# Patient Record
Sex: Male | Born: 1950 | Race: Black or African American | Hispanic: No | Marital: Single | Smoking: Never smoker
Health system: Southern US, Community
[De-identification: ages and names within clinical notes are randomized; demographics above are authoritative.]

## PROBLEM LIST (undated history)

## (undated) DIAGNOSIS — E119 Type 2 diabetes mellitus without complications: Secondary | ICD-10-CM

## (undated) DIAGNOSIS — I10 Essential (primary) hypertension: Secondary | ICD-10-CM

## (undated) DIAGNOSIS — I639 Cerebral infarction, unspecified: Secondary | ICD-10-CM

## (undated) DIAGNOSIS — M25512 Pain in left shoulder: Secondary | ICD-10-CM

## (undated) DIAGNOSIS — I63511 Cerebral infarction due to unspecified occlusion or stenosis of right middle cerebral artery: Secondary | ICD-10-CM

## (undated) DIAGNOSIS — J4520 Mild intermittent asthma, uncomplicated: Secondary | ICD-10-CM

## (undated) DIAGNOSIS — G8929 Other chronic pain: Secondary | ICD-10-CM

## (undated) DIAGNOSIS — K219 Gastro-esophageal reflux disease without esophagitis: Secondary | ICD-10-CM

## (undated) HISTORY — DX: Essential (primary) hypertension: I10

## (undated) HISTORY — DX: Gastro-esophageal reflux disease without esophagitis: K21.9

## (undated) HISTORY — DX: Cerebral infarction due to unspecified occlusion or stenosis of right middle cerebral artery: I63.511

## (undated) HISTORY — DX: Mild intermittent asthma, uncomplicated: J45.20

## (undated) HISTORY — DX: Pain in left shoulder: M25.512

## (undated) HISTORY — DX: Other chronic pain: G89.29

## (undated) HISTORY — PX: APPENDECTOMY: SHX54

---

## 2014-06-18 ENCOUNTER — Encounter (HOSPITAL_COMMUNITY): Payer: Self-pay | Admitting: Emergency Medicine

## 2014-06-18 ENCOUNTER — Emergency Department (HOSPITAL_COMMUNITY)

## 2014-06-18 ENCOUNTER — Inpatient Hospital Stay (HOSPITAL_COMMUNITY)
Admission: EM | Admit: 2014-06-18 | Discharge: 2014-06-22 | DRG: 041 | Disposition: A | Discharge status: Home / Self Care | Attending: Internal Medicine | Admitting: Internal Medicine

## 2014-06-18 DIAGNOSIS — R9431 Abnormal electrocardiogram [ECG] [EKG]: Secondary | ICD-10-CM | POA: Insufficient documentation

## 2014-06-18 DIAGNOSIS — G8190 Hemiplegia, unspecified affecting unspecified side: Secondary | ICD-10-CM | POA: Diagnosis present

## 2014-06-18 DIAGNOSIS — R4789 Other speech disturbances: Secondary | ICD-10-CM | POA: Diagnosis present

## 2014-06-18 DIAGNOSIS — R209 Unspecified disturbances of skin sensation: Secondary | ICD-10-CM | POA: Diagnosis present

## 2014-06-18 DIAGNOSIS — I6350 Cerebral infarction due to unspecified occlusion or stenosis of unspecified cerebral artery: Principal | ICD-10-CM | POA: Diagnosis present

## 2014-06-18 DIAGNOSIS — Z7982 Long term (current) use of aspirin: Secondary | ICD-10-CM

## 2014-06-18 DIAGNOSIS — E785 Hyperlipidemia, unspecified: Secondary | ICD-10-CM | POA: Diagnosis present

## 2014-06-18 DIAGNOSIS — R29898 Other symptoms and signs involving the musculoskeletal system: Secondary | ICD-10-CM | POA: Diagnosis present

## 2014-06-18 DIAGNOSIS — M25512 Pain in left shoulder: Secondary | ICD-10-CM

## 2014-06-18 DIAGNOSIS — E119 Type 2 diabetes mellitus without complications: Secondary | ICD-10-CM | POA: Diagnosis present

## 2014-06-18 DIAGNOSIS — J45909 Unspecified asthma, uncomplicated: Secondary | ICD-10-CM | POA: Diagnosis present

## 2014-06-18 DIAGNOSIS — J4520 Mild intermittent asthma, uncomplicated: Secondary | ICD-10-CM | POA: Insufficient documentation

## 2014-06-18 DIAGNOSIS — I10 Essential (primary) hypertension: Secondary | ICD-10-CM | POA: Diagnosis present

## 2014-06-18 DIAGNOSIS — I63511 Cerebral infarction due to unspecified occlusion or stenosis of right middle cerebral artery: Secondary | ICD-10-CM | POA: Diagnosis present

## 2014-06-18 DIAGNOSIS — K219 Gastro-esophageal reflux disease without esophagitis: Secondary | ICD-10-CM | POA: Diagnosis present

## 2014-06-18 DIAGNOSIS — Z79899 Other long term (current) drug therapy: Secondary | ICD-10-CM

## 2014-06-18 DIAGNOSIS — G8929 Other chronic pain: Secondary | ICD-10-CM | POA: Insufficient documentation

## 2014-06-18 DIAGNOSIS — I639 Cerebral infarction, unspecified: Secondary | ICD-10-CM | POA: Diagnosis present

## 2014-06-18 DIAGNOSIS — Z8249 Family history of ischemic heart disease and other diseases of the circulatory system: Secondary | ICD-10-CM

## 2014-06-18 DIAGNOSIS — G459 Transient cerebral ischemic attack, unspecified: Secondary | ICD-10-CM

## 2014-06-18 HISTORY — DX: Type 2 diabetes mellitus without complications: E11.9

## 2014-06-18 HISTORY — DX: Cerebral infarction, unspecified: I63.9

## 2014-06-18 HISTORY — DX: Essential (primary) hypertension: I10

## 2014-06-18 LAB — COMPREHENSIVE METABOLIC PANEL
ALBUMIN: 4.3 g/dL (ref 3.5–5.2)
ALT: 21 U/L (ref 0–53)
AST: 26 U/L (ref 0–37)
Alkaline Phosphatase: 109 U/L (ref 39–117)
Anion gap: 13 (ref 5–15)
BILIRUBIN TOTAL: 0.4 mg/dL (ref 0.3–1.2)
BUN: 17 mg/dL (ref 6–23)
CALCIUM: 9.2 mg/dL (ref 8.4–10.5)
CHLORIDE: 106 meq/L (ref 96–112)
CO2: 25 mEq/L (ref 19–32)
Creatinine, Ser: 1.15 mg/dL (ref 0.50–1.35)
GFR calc Af Amer: 77 mL/min — ABNORMAL LOW (ref >90)
GFR calc non Af Amer: 66 mL/min — ABNORMAL LOW (ref >90)
Glucose, Bld: 96 mg/dL (ref 70–99)
Potassium: 3.9 mEq/L (ref 3.7–5.3)
SODIUM: 144 meq/L (ref 137–147)
Total Protein: 7.9 g/dL (ref 6.0–8.3)

## 2014-06-18 LAB — GLUCOSE, CAPILLARY
GLUCOSE-CAPILLARY: 148 mg/dL — AB (ref 70–99)
Glucose-Capillary: 76 mg/dL (ref 70–99)

## 2014-06-18 LAB — CBC
HCT: 43.5 % (ref 39.0–52.0)
Hemoglobin: 14.5 g/dL (ref 13.0–17.0)
MCH: 28.4 pg (ref 26.0–34.0)
MCHC: 33.3 g/dL (ref 30.0–36.0)
MCV: 85.1 fL (ref 78.0–100.0)
PLATELETS: 239 10*3/uL (ref 150–400)
RBC: 5.11 MIL/uL (ref 4.22–5.81)
RDW: 13.9 % (ref 11.5–15.5)
WBC: 9.8 10*3/uL (ref 4.0–10.5)

## 2014-06-18 LAB — LIPID PANEL
Cholesterol: 239 mg/dL — ABNORMAL HIGH (ref 0–200)
HDL: 36 mg/dL — AB (ref >39)
LDL Cholesterol: 178 mg/dL — ABNORMAL HIGH (ref 0–99)
TRIGLYCERIDES: 123 mg/dL (ref <150)
Total CHOL/HDL Ratio: 6.6 RATIO
VLDL: 25 mg/dL (ref 0–40)

## 2014-06-18 LAB — RAPID URINE DRUG SCREEN, HOSP PERFORMED
AMPHETAMINES: NOT DETECTED
BARBITURATES: NOT DETECTED
BENZODIAZEPINES: NOT DETECTED
Cocaine: NOT DETECTED
Opiates: NOT DETECTED
Tetrahydrocannabinol: NOT DETECTED

## 2014-06-18 LAB — DIFFERENTIAL
BASOS ABS: 0 10*3/uL (ref 0.0–0.1)
BASOS PCT: 0 % (ref 0–1)
Eosinophils Absolute: 0.2 10*3/uL (ref 0.0–0.7)
Eosinophils Relative: 2 % (ref 0–5)
Lymphocytes Relative: 34 % (ref 12–46)
Lymphs Abs: 3.4 10*3/uL (ref 0.7–4.0)
Monocytes Absolute: 0.5 10*3/uL (ref 0.1–1.0)
Monocytes Relative: 5 % (ref 3–12)
NEUTROS ABS: 5.7 10*3/uL (ref 1.7–7.7)
Neutrophils Relative %: 59 % (ref 43–77)

## 2014-06-18 LAB — HEMOGLOBIN A1C
Hgb A1c MFr Bld: 6.7 % — ABNORMAL HIGH (ref <5.7)
Mean Plasma Glucose: 146 mg/dL — ABNORMAL HIGH (ref <117)

## 2014-06-18 LAB — APTT: APTT: 33 s (ref 24–37)

## 2014-06-18 LAB — I-STAT TROPONIN, ED: TROPONIN I, POC: 0 ng/mL (ref 0.00–0.08)

## 2014-06-18 LAB — TROPONIN I: Troponin I: <0.3 ng/mL (ref <0.30)

## 2014-06-18 LAB — PROTIME-INR
INR: 1.02 (ref 0.00–1.49)
PROTHROMBIN TIME: 13.4 s (ref 11.6–15.2)

## 2014-06-18 MED ORDER — ATORVASTATIN CALCIUM 40 MG PO TABS
40.0000 mg | ORAL_TABLET | Freq: Every day | ORAL | Status: DC
  Administered 2014-06-18 – 2014-06-22 (×5): 40 mg via ORAL
  Filled 2014-06-18 (×5): qty 1

## 2014-06-18 MED ORDER — TRAMADOL HCL 50 MG PO TABS: 50.0000 mg | ORAL_TABLET | Freq: Four times a day (QID) | ORAL | Status: DC | PRN

## 2014-06-18 MED ORDER — ASPIRIN EC 81 MG PO TBEC: 81.0000 mg | DELAYED_RELEASE_TABLET | Freq: Every day | ORAL | Status: DC

## 2014-06-18 MED ORDER — LOSARTAN POTASSIUM 50 MG PO TABS: 25.0000 mg | ORAL_TABLET | Freq: Every day | ORAL | Status: DC

## 2014-06-18 MED ORDER — STROKE: EARLY STAGES OF RECOVERY BOOK
Freq: Once | Status: AC
  Administered 2014-06-18: 18:00:00
  Filled 2014-06-18: qty 1

## 2014-06-18 MED ORDER — STUDY - INVESTIGATIONAL DRUG SIMPLE RECORD
90.0000 mg | Freq: Two times a day (BID) | Status: DC
  Filled 2014-06-18: qty 90

## 2014-06-18 MED ORDER — STUDY - INVESTIGATIONAL DRUG SIMPLE RECORD
300.0000 mg | Freq: Once | Status: AC
  Administered 2014-06-18: 300 mg via ORAL
  Filled 2014-06-18: qty 300

## 2014-06-18 MED ORDER — STUDY - INVESTIGATIONAL DRUG SIMPLE RECORD
90.0000 mg | Freq: Two times a day (BID) | Status: DC
  Administered 2014-06-19 – 2014-06-22 (×7): 90 mg via ORAL
  Filled 2014-06-18 (×4): qty 90

## 2014-06-18 MED ORDER — ASPIRIN 325 MG PO TABS: 325.0000 mg | ORAL_TABLET | Freq: Every day | ORAL | Status: DC

## 2014-06-18 MED ORDER — PANTOPRAZOLE SODIUM 40 MG PO TBEC
40.0000 mg | DELAYED_RELEASE_TABLET | Freq: Every day | ORAL | Status: DC
Start: 2014-06-19 — End: 2014-06-22
  Administered 2014-06-19 – 2014-06-22 (×4): 40 mg via ORAL
  Filled 2014-06-18 (×4): qty 1

## 2014-06-18 MED ORDER — ASPIRIN 325 MG PO TABS
325.0000 mg | ORAL_TABLET | Freq: Every day | ORAL | Status: DC
Start: 2014-06-18 — End: 2014-06-18

## 2014-06-18 MED ORDER — INSULIN ASPART 100 UNIT/ML ~~LOC~~ SOLN
0.0000 [IU] | Freq: Three times a day (TID) | SUBCUTANEOUS | Status: DC
  Administered 2014-06-19 – 2014-06-20 (×3): 1 [IU] via SUBCUTANEOUS
  Administered 2014-06-22: 2 [IU] via SUBCUTANEOUS

## 2014-06-18 MED ORDER — STUDY - INVESTIGATIONAL DRUG SIMPLE RECORD
100.0000 mg | Freq: Every day | Status: DC
  Administered 2014-06-19 – 2014-06-22 (×4): 100 mg via ORAL
  Filled 2014-06-18 (×3): qty 100

## 2014-06-18 MED ORDER — INSULIN GLARGINE 100 UNIT/ML ~~LOC~~ SOLN
10.0000 [IU] | Freq: Every day | SUBCUTANEOUS | Status: DC
  Administered 2014-06-18 – 2014-06-21 (×4): 10 [IU] via SUBCUTANEOUS
  Filled 2014-06-18 (×5): qty 0.1

## 2014-06-18 MED ORDER — STUDY - INVESTIGATIONAL DRUG SIMPLE RECORD
180.0000 mg | Freq: Once | Status: AC
  Administered 2014-06-18: 180 mg via ORAL
  Filled 2014-06-18: qty 180

## 2014-06-18 NOTE — Progress Notes (Signed)
Attempted to get report from ED @1459 , RN is busy and unable to give report at this time. Left number for nurse to call 4N back when gets a chance Leevon Upperman, [place] Marie, RN 2:49 PM   .

## 2014-06-18 NOTE — ED Notes (Signed)
Per pt today he had a 10 min episode of left arm numbness and weakness today while driving. sts he lost control of arm. sts also left leg was affected. sts all symptoms have resolved and he feels normal now. sts he has had a few episodes like this over the past few weeks but not like today.

## 2014-06-18 NOTE — Progress Notes (Signed)
PT Cancellation Note  Patient Details Name: Stephen Mccullough MRN: [id number] DOB: 03-25-1951   Cancelled Treatment:    Reason Eval/Treat Not Completed: Other (comment) (pt on bedrest).  PT paged resident.  Will check back later today or tomorrow as time allows.   Thanks,    Rollene Rotundaebecca B. Bradely Rudin, PT, DPT [phone]   06/18/2014, 3:46 PM

## 2014-06-18 NOTE — ED Notes (Signed)
Patient states that he was driving his truck and his left side went numb and he could not move. Patient states that he was stopped at a stop sign at the time and he was unable to push the clutch pedal in. States that he called his brother to help him.  Onset was 11 AM. Duration was about 15 minutes.  Patient is asymptomatic at this time.

## 2014-06-18 NOTE — Consult Note (Signed)
Referring Physician: Joines    Chief Complaint: TIA  HPI:                                                                                                                                         Stephen Mccullough is an 63 y.o. male who has noted over the last few days he has had intermittent numbness and tingling in his left forearm. HE states he has two episodes a day on average.  Today he had just finished mowing a lawn when he stopped at his house.  He went to push the clutch with his left foot and noted he had no strength and could not feel the pedal. At the same time he noted his left hand was week and had no strength.  He called his brother who came over and helped him out of the car.  Within 15 minutes the symptoms resolved.   He takes ASA daily  Date last known well: Date: 06/18/2014 Time last known well: Time: 11:00 tPA Given: No: symptoms fully resolved.   Past Medical History  Diagnosis Date  . Hypertension   . Diabetes mellitus without complication     Past Surgical History  Procedure Laterality Date  . Appendectomy      Family History  Problem Relation Age of Onset  . Hypertension Mother   . Hypertension Father    Social History:  reports that he has never smoked. He does not have any smokeless tobacco history on file. He reports that he does not drink alcohol. His drug history is not on file.  Allergies: No Known Allergies  Medications:                                                                                                                           ASA  Insulin and lantus   ROS:  History obtained from the patient  General ROS: negative for - chills, fatigue, fever, night sweats, weight gain or weight loss Psychological ROS: negative for - behavioral disorder, hallucinations, memory difficulties, mood swings or suicidal  ideation Ophthalmic ROS: negative for - blurry vision, double vision, eye pain or loss of vision ENT ROS: negative for - epistaxis, nasal discharge, oral lesions, sore throat, tinnitus or vertigo Allergy and Immunology ROS: negative for - hives or itchy/watery eyes Hematological and Lymphatic ROS: negative for - bleeding problems, bruising or swollen lymph nodes Endocrine ROS: negative for - galactorrhea, hair pattern changes, polydipsia/polyuria or temperature intolerance Respiratory ROS: negative for - cough, hemoptysis, shortness of breath or wheezing Cardiovascular ROS: negative for - chest pain, dyspnea on exertion, edema or irregular heartbeat Gastrointestinal ROS: negative for - abdominal pain, diarrhea, hematemesis, nausea/vomiting or stool incontinence Genito-Urinary ROS: negative for - dysuria, hematuria, incontinence or urinary frequency/urgency Musculoskeletal ROS: negative for - joint swelling or muscular weakness Neurological ROS: as noted in HPI Dermatological ROS: negative for rash and skin lesion changes  Neurologic Examination:                                                                                                      Blood pressure 143/73, pulse 76, temperature 98.1 F (36.7 C), temperature source Oral, resp. rate 18, weight 83.915 kg (185 lb), SpO2 97.00%.   General: NAD Mental Status: Alert, oriented, thought content appropriate.  Speech fluent without evidence of aphasia.  Able to follow 3 step commands without difficulty. Cranial Nerves: II: Discs flat bilaterally; Visual fields grossly normal, pupils equal, round, reactive to light and accommodation III,IV, VI: ptosis not present, extra-ocular motions intact bilaterally V,VII: smile symmetric, facial light touch sensation normal bilaterally VIII: hearing normal bilaterally IX,X: gag reflex present XI: bilateral shoulder shrug XII: midline tongue extension without atrophy or fasciculations  Motor: Right  : Upper extremity   5/5    Left:     Upper extremity   5/5  Lower extremity   5/5     Lower extremity   5/5 Tone and bulk:normal tone throughout; no atrophy noted Sensory: Pinprick and light touch intact throughout, bilaterally --positive Tinnels on left arm --decreased temperature in stocking distribution Deep Tendon Reflexes:  Right: Upper Extremity   Left: Upper extremity   biceps (C-5 to C-6) 2/4   biceps (C-5 to C-6) 2/4 tricep (C7) 2/4    triceps (C7) 2/4 Brachioradialis (C6) 2/4  Brachioradialis (C6) 2/4  Lower Extremity Lower Extremity  quadriceps (L-2 to L-4) 1/4   quadriceps (L-2 to L-4) 1/4 Achilles (S1) 0/4   Achilles (S1) 0/4  Plantars: Right: downgoing   Left: downgoing Cerebellar: normal finger-to-nose,  normal heel-to-shin test Gait: not tested due to multiple leads.  CV: pulses palpable throughout    Lab Results: Basic Metabolic Panel:  Recent Labs Lab 06/18/14 1226  NA 144  K 3.9  CL 106  CO2 25  GLUCOSE 96  BUN 17  CREATININE 1.15  CALCIUM 9.2    Liver Function Tests:  Recent Labs Lab 06/18/14 1226  AST 26  ALT 21  ALKPHOS 109  BILITOT 0.4  PROT 7.9  ALBUMIN 4.3   No results found for this basename: LIPASE, AMYLASE,  in the last 168 hours No results found for this basename: AMMONIA,  in the last 168 hours  CBC:  Recent Labs Lab 06/18/14 1226  WBC 9.8  NEUTROABS 5.7  HGB 14.5  HCT 43.5  MCV 85.1  PLT 239    Cardiac Enzymes: No results found for this basename: CKTOTAL, CKMB, CKMBINDEX, TROPONINI,  in the last 168 hours  Lipid Panel: No results found for this basename: CHOL, TRIG, HDL, CHOLHDL, VLDL, LDLCALC,  in the last 168 hours  CBG: No results found for this basename: GLUCAP,  in the last 168 hours  Microbiology: No results found for this or any previous visit.  Coagulation Studies:  Recent Labs  06/18/14 1226  LABPROT 13.4  INR 1.02    Imaging: Ct Head (brain) Wo Contrast  06/18/2014   CLINICAL DATA:   Left arm and leg weakness.  EXAM: CT HEAD WITHOUT CONTRAST  TECHNIQUE: Contiguous axial images were obtained from the base of the skull through the vertex without intravenous contrast.  COMPARISON:  None.  FINDINGS: Chronic microvascular changes throughout the deep white matter. No acute intracranial abnormality. Specifically, no hemorrhage, hydrocephalus, mass lesion, acute infarction, or significant intracranial injury. No acute calvarial abnormality. Visualized paranasal sinuses and mastoids clear. Orbital soft tissues unremarkable.  IMPRESSION: Chronic small vessel disease throughout the deep white matter. No acute intracranial abnormality.   Electronically Signed   By: Charlett Nose M.D.   On: 06/18/2014 12:52       Assessment and plan discussed with with attending physician and they are in agreement.    Felicie Morn PA-C [facility] [phone]  06/18/2014, 2:02 PM   Assessment: 63 y.o. male with transient left arm and leg paresthesia and weakness. At time of consultation symptoms had fully resolved.  With stroke risk factors and symptoms likely TIA. Patient would benefit from TIA work up.   Stroke Risk Factors - diabetes mellitus and hypertension  1. HgbA1c, fasting lipid panel 2. MRI, MRA  of the brain without contrast 3. PT consult, OT consult, Speech consult 4. Echocardiogram 5. Carotid dopplers 6. Prophylactic therapy-Antiplatelet med: Aspirin - dose 325 daily 7. Risk factor modification 8. Telemetry monitoring 9. Frequent neuro checks  Ritta Slot, MD [facility] [phone]  If 7pm- 7am, please page neurology on call as listed in [facility]ION.

## 2014-06-18 NOTE — ED Provider Notes (Signed)
CSN: [id number]     Arrival date & time 06/18/14  1205 History   First MD Initiated Contact with Patient 06/18/14 1227     Chief Complaint  Patient presents with  . Numbness  . Weakness     (Consider location/radiation/quality/duration/timing/severity/associated sxs/prior Treatment) HPI Comments: The patient is a 63 year old male past medical history of hypertension and diabetes presents emergency room chief complaint of left-sided weakness at approximately 11:00. The patient reports he was unable to move his left arm and left lower leg while driving. He reports he had to be helped out of the car by friends. He reports symptoms of resolved, after approximately 15 minutes. The patient reports left arm paresthesias last night. He denies history of CVA TIA denies history of anticoagulation. Reports ASA 81 mg daily. Denies EtOH or drug use. PCP [facility]  Patient is a 63 y.o. male presenting with weakness. The history is provided by the patient. No language interpreter was used.  Weakness Associated symptoms include numbness and weakness. Pertinent negatives include no abdominal pain or headaches.    Past Medical History  Diagnosis Date  . Hypertension   . Diabetes mellitus without complication    Past Surgical History  Procedure Laterality Date  . Appendectomy     History reviewed. No pertinent family history. History  Substance Use Topics  . Smoking status: Never Smoker   . Smokeless tobacco: Not on file  . Alcohol Use: No    Review of Systems  Eyes: Negative for visual disturbance.  Gastrointestinal: Negative for abdominal pain.  Neurological: Positive for weakness and numbness. Negative for syncope, light-headedness and headaches.      Allergies  Review of patient's allergies indicates no known allergies.  Home Medications   Prior to Admission medications   Not on File   BP 143/73  Pulse 76  Temp(Src) 97.4 F (36.3 C) (Oral)  Resp 18  Wt 185 lb (83.915 kg)   SpO2 97% Physical Exam  Nursing note and vitals reviewed. Constitutional: He appears well-developed and well-nourished. No distress.  HENT:  Head: Normocephalic and atraumatic.  Eyes: EOM are normal. Pupils are equal, round, and reactive to light.  Neck: Normal range of motion. Neck supple.  Cardiovascular: Normal rate.  An irregular rhythm present.  Pulses:      Radial pulses are 2+ on the right side, and 2+ on the left side.  Pulmonary/Chest: Effort normal and breath sounds normal. He has no wheezes.  Abdominal: Soft. There is no tenderness.  Neurological: He is alert.  Speech is clear and goal oriented, follows commands Cranial nerves III - XII grossly intact, no facial droop Normal strength in upper and lower extremities bilaterally, strong and equal grip strength Sensation normal to light touch Moves all 4 extremities without ataxia, coordination intact. Normal heel to shin bilaterally. Normal finger to nose and rapid alternating movements No pronator drift  Skin: Skin is warm and dry. He is not diaphoretic.  Psychiatric: He has a normal mood and affect. His behavior is normal.    ED Course  Procedures (including critical care time) Labs Review Results for orders placed during the hospital encounter of 06/18/14  PROTIME-INR      Result Value Ref Range   Prothrombin Time 13.4  11.6 - 15.2 seconds   INR 1.02  0.00 - 1.49  APTT      Result Value Ref Range   aPTT 33  24 - 37 seconds  CBC      Result Value Ref  Range   WBC 9.8  4.0 - 10.5 K/uL   RBC 5.11  4.22 - 5.81 MIL/uL   Hemoglobin 14.5  13.0 - 17.0 g/dL   HCT 40.9  81.1 - 91.4 %   MCV 85.1  78.0 - 100.0 fL   MCH 28.4  26.0 - 34.0 pg   MCHC 33.3  30.0 - 36.0 g/dL   RDW 78.2  95.6 - 21.3 %   Platelets 239  150 - 400 K/uL  DIFFERENTIAL      Result Value Ref Range   Neutrophils Relative % 59  43 - 77 %   Neutro Abs 5.7  1.7 - 7.7 K/uL   Lymphocytes Relative 34  12 - 46 %   Lymphs Abs 3.4  0.7 - 4.0 K/uL    Monocytes Relative 5  3 - 12 %   Monocytes Absolute 0.5  0.1 - 1.0 K/uL   Eosinophils Relative 2  0 - 5 %   Eosinophils Absolute 0.2  0.0 - 0.7 K/uL   Basophils Relative 0  0 - 1 %   Basophils Absolute 0.0  0.0 - 0.1 K/uL  COMPREHENSIVE METABOLIC PANEL      Result Value Ref Range   Sodium 144  137 - 147 mEq/L   Potassium 3.9  3.7 - 5.3 mEq/L   Chloride 106  96 - 112 mEq/L   CO2 25  19 - 32 mEq/L   Glucose, Bld 96  70 - 99 mg/dL   BUN 17  6 - 23 mg/dL   Creatinine, Ser 0.86  0.50 - 1.35 mg/dL   Calcium 9.2  8.4 - 57.8 mg/dL   Total Protein 7.9  6.0 - 8.3 g/dL   Albumin 4.3  3.5 - 5.2 g/dL   AST 26  0 - 37 U/L   ALT 21  0 - 53 U/L   Alkaline Phosphatase 109  39 - 117 U/L   Total Bilirubin 0.4  0.3 - 1.2 mg/dL   GFR calc non Af Amer 66 (*) >90 mL/min   GFR calc Af Amer 77 (*) >90 mL/min   Anion gap 13  5 - 15  I-STAT TROPOININ, ED      Result Value Ref Range   Troponin i, poc 0.00  0.00 - 0.08 ng/mL   Comment 3            Ct Head (brain) Wo Contrast  06/18/2014   CLINICAL DATA:  Left arm and leg weakness.  EXAM: CT HEAD WITHOUT CONTRAST  TECHNIQUE: Contiguous axial images were obtained from the base of the skull through the vertex without intravenous contrast.  COMPARISON:  None.  FINDINGS: Chronic microvascular changes throughout the deep white matter. No acute intracranial abnormality. Specifically, no hemorrhage, hydrocephalus, mass lesion, acute infarction, or significant intracranial injury. No acute calvarial abnormality. Visualized paranasal sinuses and mastoids clear. Orbital soft tissues unremarkable.  IMPRESSION: Chronic small vessel disease throughout the deep white matter. No acute intracranial abnormality.   Electronically Signed   By: Charlett Nose M.D.   On: 06/18/2014 12:52    EKG Interpretation   Date/Time:  Friday June 18 2014 12:15:03 EDT Ventricular Rate:  75 PR Interval:  166 QRS Duration: 76 QT Interval:  376 QTC Calculation: 419 R Axis:   13 Text  Interpretation:  Sinus rhythm with Premature atrial complexes  Nonspecific T wave abnormality Abnormal ECG No old tracing to compare  Confirmed by CAMPOS  MD, KEVIN ([id number]) on 06/18/2014 12:57:01 PM  MDM   Final diagnoses:  Transient cerebral ischemia, unspecified transient cerebral ischemia type   Pt presents with left upper and lower extremity weakness for approximately 10-15 minutes today. At baseline no neurologic deficits on exam. Plan to admit for TIA workup. Dr. Patria Maneampos also evaluated during this encounter. CT negative for acute findings. Labs without correlating cause or concerning abnormalitites. Consult to neurologist who agrees to consult on the patient Discussed patient history with internal medicine who agrees to admit the patient.    Mellody DrownLauren Benigna Delisi, PA-C 06/18/14 2046

## 2014-06-18 NOTE — ED Notes (Signed)
Pt placed in room and on monitor . 

## 2014-06-18 NOTE — Evaluation (Signed)
Physical Therapy Evaluation/Discharge Patient Details Name: Stephen Mccullough MRN: [id number] DOB: Nov 11, 1951 Today's Date: 06/18/2014   History of Present Illness  63 y.o. male admitted to [facility] on 06/18/14 with left sided weakness.  CT negative.  Stroke workup in progress. Pt with significant PMhx of DM and HTN.   Clinical Impression  Pt is mobilizing well with PT.  He feels he is at his baseline.  I only noticed one significant thing during my assessment and that is he could not find two things in his left visual field and did it twice during the session.  I grossly tested peripheral vision and it was diminished on his left more so than his right, but not so much that I would have anticipated how difficult it would be to find things placed on his left side.  I have asked OT to further assess his vision during their evaluation.  He does wear progressive bifocals and had them on during my entire session.  PT to sign off as he is mobilizing very well.      Follow Up Recommendations No PT follow up    Equipment Recommendations  None recommended by PT    Recommendations for Other Services   NA    Precautions / Restrictions Precautions Precautions: None      Mobility  Bed Mobility Overal bed mobility: Independent                Transfers Overall transfer level: Independent                  Ambulation/Gait Ambulation/Gait assistance: Supervision Ambulation Distance (Feet): 250 Feet Assistive device: None Gait Pattern/deviations: Step-through pattern   Gait velocity interpretation: at or above normal speed for age/gender General Gait Details: Pt with very mild staggering gait pattern with head turns horizontally, but did not need external assist to correct.   Stairs Stairs: Yes Stairs assistance: Modified independent (Device/Increase time) Stair Management: One rail Right;Alternating pattern;Forwards Number of Stairs: 9 General stair comments: WNL      Modified Rankin  (Stroke Patients Only) Modified Rankin (Stroke Patients Only) Pre-Morbid Rankin Score: No symptoms Modified Rankin: No significant disability     Balance                                 Standardized Balance Assessment Standardized Balance Assessment : Dynamic Gait Index   Dynamic Gait Index Level Surface: Normal Change in Gait Speed: Normal Gait with Horizontal Head Turns: Mild Impairment Gait with Vertical Head Turns: Normal Gait and Pivot Turn: Normal Step Over Obstacle: Normal Step Around Obstacles: Normal Steps: Mild Impairment Total Score: 22       Pertinent Vitals/Pain Pain Assessment: No/denies pain    Home Living Family/patient expects to be discharged to:: Private residence Living Arrangements: Parent;Other relatives (with mother and brother) Available Help at Discharge: Family;Available 24 hours/day;Available PRN/intermittently (brother and pt pick up odd jobs, mom is 70) Type of Home: House Home Access: Ramped entrance     Home Layout: One level Home Equipment: None Additional Comments: tub shower, regular commode    Prior Function Level of Independence: Independent         Comments: drives, works odd jobs (yard work, Gaffer work)     Higher education careers adviser   Dominant Hand: Right    Extremity/Trunk Assessment   Upper Extremity Assessment: Defer to OT evaluation           Lower  Extremity Assessment: Overall WFL for tasks assessed (strength, sensation, coordination all WNL)      Cervical / Trunk Assessment: Normal  Communication   Communication: No difficulties (per pt report when episode was happening slurred)  Cognition Arousal/Alertness: Awake/alert Behavior During Therapy: WFL for tasks assessed/performed Overall Cognitive Status: Within Functional Limits for tasks assessed                               Assessment/Plan    PT Assessment Patent does not need any further PT services           PT Goals  (Current goals can be found in the Care Plan section) Acute Rehab PT Goals Patient Stated Goal: to go home and get something to eat PT Goal Formulation: No goals set, d/c therapy     End of Session Equipment Utilized During Treatment: Gait belt Activity Tolerance: Patient tolerated treatment well Patient left: in chair;with call bell/phone within reach Nurse Communication: Mobility status    Functional Assessment Tool Used: assist level Functional Limitation: Mobility: Walking and moving around Mobility: Walking and Moving Around Current Status ([id number](G8978): At least 1 percent but less than 20 percent impaired, limited or restricted Mobility: Walking and Moving Around Goal Status [phone](G8979): At least 1 percent but less than 20 percent impaired, limited or restricted Mobility: Walking and Moving Around Discharge Status [phone](G8980): At least 1 percent but less than 20 percent impaired, limited or restricted    Time: [id number] PT Time Calculation (min): 28 min   Charges:   PT Evaluation $Initial PT Evaluation Tier I: 1 Procedure PT Treatments $Gait Training: 8-22 mins   PT G Codes:   Functional Assessment Tool Used: assist level Functional Limitation: Mobility: Walking and moving around    Conkling ParkRebecca B. Fronnie Urton, PT, DPT [phone]   06/18/2014, 5:02 PM
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
## 2014-06-19 ENCOUNTER — Observation Stay (HOSPITAL_COMMUNITY)

## 2014-06-19 DIAGNOSIS — I639 Cerebral infarction, unspecified: Secondary | ICD-10-CM | POA: Diagnosis present

## 2014-06-19 DIAGNOSIS — I517 Cardiomegaly: Secondary | ICD-10-CM

## 2014-06-19 LAB — GLUCOSE, CAPILLARY
GLUCOSE-CAPILLARY: 123 mg/dL — AB (ref 70–99)
Glucose-Capillary: 134 mg/dL — ABNORMAL HIGH (ref 70–99)
Glucose-Capillary: 118 mg/dL — ABNORMAL HIGH (ref 70–99)
Glucose-Capillary: 136 mg/dL — ABNORMAL HIGH (ref 70–99)

## 2014-06-19 LAB — TROPONIN I
Troponin I: <0.3 ng/mL (ref <0.30)
Troponin I: <0.3 ng/mL (ref <0.30)

## 2014-06-19 MED ORDER — PNEUMOCOCCAL VAC POLYVALENT 25 MCG/0.5ML IJ INJ
0.5000 mL | INJECTION | INTRAMUSCULAR | Status: AC
  Administered 2014-06-21: 0.5 mL via INTRAMUSCULAR
  Filled 2014-06-19: qty 0.5

## 2014-06-19 NOTE — ED Provider Notes (Signed)
Medical screening examination/treatment/procedure(s) were conducted as a shared visit with non-physician practitioner(s) and myself.  I personally evaluated the patient during the encounter.   EKG Interpretation   Date/Time:  Friday June 18 2014 12:15:03 EDT Ventricular Rate:  75 PR Interval:  166 QRS Duration: 76 QT Interval:  376 QTC Calculation: 419 R Axis:   13 Text Interpretation:  Sinus rhythm with Premature atrial complexes  Nonspecific T wave abnormality Abnormal ECG No old tracing to compare  Confirmed by Emanuela Runnion  MD, Caryn BeeKEVIN ([id number]) on 06/18/2014 12:57:01 PM      Symptoms concerning for TIA.  Complete resolution of left-sided weakness at this time.  Admit to the hospital.  Neurology consultation.  Head CT without acute abnormalities.  No prior history of stroke.  Patient with hypertension, hyperlipidemia, diabetes.  Lyanne CoKevin M Trystian Crisanto, MD 06/19/14 1115

## 2014-06-19 NOTE — Progress Notes (Signed)
UR Completed.  Candies Palm Jane [phone] 06/19/2014  

## 2014-06-19 NOTE — Progress Notes (Signed)
Occupational Therapy Evaluation and Discharge Patient Details Name: Stephen Mccullough MRN: [id number] DOB: Jun 04, 1951 Today's Date: 06/19/2014    History of Present Illness 63 y.o. male [facility] with left sided weakness.  CT negative.  Stroke workup in progress. Pt with significant PMhx of DM and HTN.    Clinical Impression   PTA pt was independent with ADLs and functional mobility. 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 PT noted difficulty with pt locating items on his left side yesterday. Visual fields appear to be intact and vision screen WNL. Pt is aware of difficulty yesterday and educated on using visual scanning of environment to locate and identify safety hazards. Pt practiced in bed scanning environment. Pt overall at Mod independence with ADLs and no acute OT needs.     Follow Up Recommendations  No OT follow up    Equipment Recommendations  None recommended by OT       Precautions / Restrictions Precautions Precautions: None Restrictions Weight Bearing Restrictions: No      Mobility Bed Mobility Overal bed mobility: Independent                Transfers Overall transfer level: Independent                    Balance Overall balance assessment: No apparent balance deficits (not formally assessed)                                          ADL Overall ADL's : Modified independent                                       General ADL Comments: Educated pt on safety with ADLs as well as signs and symptoms of stroke using F.A.S.T acronym and pt teachback. Encouraged pt to seek help immediately if he experiences these symptoms.      Vision Eye Alignment: Within Functional Limits Alignment/Gaze Preference: Within Defined Limits Ocular Range of Motion: Within Functional Limits Tracking/Visual Pursuits: Able to track stimulus in all quads without difficulty Saccades:  Within functional limits Convergence: Within functional limits     Additional Comments: No apparent visual field cuts. Pt is aware of difficulty finding objects on his left side with PT yesterday. Encouraged pt to use visual scanning with full head turn during community mobility and ambulation to check for safety hazards.    Perception Perception Perception Tested?: No   Praxis Praxis Praxis tested?: Within functional limits    Pertinent Vitals/Pain Pain Assessment: No/denies pain     Hand Dominance Right   Extremity/Trunk Assessment Upper Extremity Assessment Upper Extremity Assessment: Overall WFL for tasks assessed   Lower Extremity Assessment Lower Extremity Assessment: Overall WFL for tasks assessed   Cervical / Trunk Assessment Cervical / Trunk Assessment: Normal   Communication Communication Communication: No difficulties   Cognition Arousal/Alertness: Awake/alert Behavior During Therapy: WFL for tasks assessed/performed Overall Cognitive Status: Within Functional Limits for tasks assessed                                Home Living Family/patient expects to be discharged to:: Private residence Living Arrangements: Other (Comment) (brother  and mother) Available Help at Discharge: Family;Available 24 hours/day;Available PRN/intermittently Type of Home: House Home Access: Ramped entrance     Home Layout: One level     Bathroom Shower/Tub: Tub/shower unit Shower/tub characteristics: Engineer, building services: Standard     Home Equipment: None          Prior Functioning/Environment Level of Independence: Independent        Comments: drives, works odd jobs (yard work, Gaffer work)                              End of Therapist, art Communication: Mobility status  Activity Tolerance: Patient tolerated treatment well Patient left: in bed;with call bell/phone within reach   Time: 1521-1534 OT Time Calculation (min): 13  min Charges:  OT General Charges $OT Visit: 1 Procedure OT Evaluation $Initial OT Evaluation Tier I: 1 Procedure  Rae Lips [phone] 06/19/2014, 3:43 PM

## 2014-06-19 NOTE — Progress Notes (Signed)
Subjective:  Patient was seen and examined today. Patient was sitting up in bed watching tv. He denies any complaints and is still asymptomatic. Denies chest pain, shortness of breath, chest tightness, cough, weakness, numbness or dizziness.   Objective: Vital signs in last 24 hours: Filed Vitals:   06/19/14 0008 06/19/14 0200 06/19/14 0400 06/19/14 0924  BP: 125/67 123/77 125/67 125/62  Pulse: 65 71 65 61  Temp: 97.7 F (36.5 C) 98 F (36.7 C) 97.7 F (36.5 C) 97.6 F (36.4 C)  TempSrc: Oral Oral Oral Oral  Resp: 18 18 16 20   Height:      Weight:      SpO2: 99% 100% 99% 100%   Weight change:  No intake or output data in the 24 hours ending 06/19/14 1043  Filed Vitals:   06/19/14 0008 06/19/14 0200 06/19/14 0400 06/19/14 0924  BP: 125/67 123/77 125/67 125/62  Pulse: 65 71 65 61  Temp: 97.7 F (36.5 C) 98 F (36.7 C) 97.7 F (36.5 C) 97.6 F (36.4 C)  TempSrc: Oral Oral Oral Oral  Resp: 18 18 16 20   Height:      Weight:      SpO2: 99% 100% 99% 100%   General: Vital signs reviewed.  Patient is well-developed and well-nourished, in no acute distress and cooperative with exam.  Head: Normocephalic and atraumatic. Eyes: EOMI, conjunctivae normal, no scleral icterus.  Neck: Supple, trachea midline, normal ROM, no JVD, masses, thyromegaly, or carotid bruit present.  Cardiovascular: Irregular rhythm, regular rate, S1 normal, S2 normal, no murmurs, gallops, or rubs. Pulmonary/Chest: Clear to auscultation bilaterally, no wheezes, rales, or rhonchi. Abdominal: Soft, non-tender, non-distended, BS +, no masses, organomegaly, or guarding present.  Musculoskeletal: No joint deformities, erythema, or stiffness, ROM full and nontender. Extremities: No lower extremity edema bilaterally,  pulses symmetric and intact bilaterally. No cyanosis or clubbing. Neurological: AA&O x3, Strength is normal and symmetric bilaterally 5/5 strength in all extremities, cranial nerve II-XII are  grossly intact, no focal motor deficit, sensory intact to light touch bilaterally.  Skin: Warm, dry and intact. No rashes or erythema. Psychiatric: Normal mood and affect. speech and behavior is normal. Cognition and memory are normal.   Lab Results: Basic Metabolic Panel:  Recent Labs Lab 06/18/14 1226  NA 144  K 3.9  CL 106  CO2 25  GLUCOSE 96  BUN 17  CREATININE 1.15  CALCIUM 9.2   Liver Function Tests:  Recent Labs Lab 06/18/14 1226  AST 26  ALT 21  ALKPHOS 109  BILITOT 0.4  PROT 7.9  ALBUMIN 4.3   CBC:  Recent Labs Lab 06/18/14 1226  WBC 9.8  NEUTROABS 5.7  HGB 14.5  HCT 43.5  MCV 85.1  PLT 239   Cardiac Enzymes:  Recent Labs Lab 06/18/14 1822 06/19/14 0020 06/19/14 0639  TROPONINI <0.30 <0.30 <0.30   CBG:  Recent Labs Lab 06/18/14 1724 06/18/14 2115 06/19/14 0643  GLUCAP 76 148* 134*   Hemoglobin A1C:  Recent Labs Lab 06/18/14 1226  HGBA1C 6.7*   Fasting Lipid Panel:  Recent Labs Lab 06/18/14 1409  CHOL 239*  HDL 36*  LDLCALC 178*  TRIG 123  CHOLHDL 6.6   Coagulation:  Recent Labs Lab 06/18/14 1226  LABPROT 13.4  INR 1.02   Urine Drug Screen: Drugs of Abuse     Component Value Date/Time   LABOPIA NONE DETECTED 06/18/2014 2157   COCAINSCRNUR NONE DETECTED 06/18/2014 2157   LABBENZ NONE DETECTED 06/18/2014 2157   AMPHETMU  NONE DETECTED 06/18/2014 2157   THCU NONE DETECTED 06/18/2014 2157   LABBARB NONE DETECTED 06/18/2014 2157    Studies/Results: Ct Head (brain) Wo Contrast  06/18/2014   CLINICAL DATA:  Left arm and leg weakness.  EXAM: CT HEAD WITHOUT CONTRAST  TECHNIQUE: Contiguous axial images were obtained from the base of the skull through the vertex without intravenous contrast.  COMPARISON:  None.  FINDINGS: Chronic microvascular changes throughout the deep white matter. No acute intracranial abnormality. Specifically, no hemorrhage, hydrocephalus, mass lesion, acute infarction, or significant intracranial injury. No  acute calvarial abnormality. Visualized paranasal sinuses and mastoids clear. Orbital soft tissues unremarkable.  IMPRESSION: Chronic small vessel disease throughout the deep white matter. No acute intracranial abnormality.   Electronically Signed   By: Charlett NoseKevin  Dover M.D.   On: 06/18/2014 12:52   Medications: I have reviewed the patient's current medications.  Prescriptions prior to admission  Medication Sig Dispense Refill  . albuterol (PROVENTIL HFA;VENTOLIN HFA) 108 (90 BASE) MCG/ACT inhaler Inhale 1-2 puffs into the lungs every 6 (six) hours as needed for wheezing or shortness of breath.      Marland Kitchen. aspirin EC 81 MG tablet Take 81 mg by mouth daily.      . insulin glargine (LANTUS) 100 UNIT/ML injection Inject 30 Units into the skin at bedtime.      . insulin regular (NOVOLIN R,HUMULIN R) 100 units/mL injection Inject 45 Units into the skin 3 (three) times daily before meals.      Marland Kitchen. lisinopril (PRINIVIL,ZESTRIL) 20 MG tablet Take 20 mg by mouth daily.      Marland Kitchen. omeprazole (PRILOSEC) 20 MG capsule Take 20 mg by mouth daily.      . traMADol (ULTRAM) 50 MG tablet Take 50 mg by mouth every 6 (six) hours as needed for moderate pain.         Scheduled Meds: . atorvastatin  40 mg Oral q1800  . insulin aspart  0-9 Units Subcutaneous TID WC  . insulin glargine  10 Units Subcutaneous QHS  . pantoprazole  40 mg Oral Daily  . research study medication  100 mg Oral Daily  . research study medication  90 mg Oral BID  . [START ON 06/20/2014] pneumococcal 23 valent vaccine  0.5 mL Intramuscular Tomorrow-1000   Continuous Infusions:  PRN Meds:.traMADol Assessment/Plan:  Mr. Stephen Mccullough is a 63 yo male with PMHx of HTN, GERD, Asthma and DM type II who presented to the ED with complaint of left sided weakness and numbness and slurred speech which lasted for 15 minutes and resolved.   TIA: Pt is asymptomatic today. No complaints. CT was negative for acute abnormality and symptoms resolved within 24 hours. Risk of  subsequent stroke per ABCD2 gives him a score of 6 which gives him a high risk (8.1% chance) of stroke within 48 hours. Seen by neurology who recommended TIA workup. Pt is participating in the [employer] study so he will not receive ASA, heparin or lovenox. He is either receiving ASA therapy or Brilinta per pharmacy.  -MRI/MRA of brain without contrast pending -PT/OT/Speech  -Echocardiogram pending -Carotid dopplers pending -Telemetry monitoring  -Neurology following -Diet carb modified  -Neurochecks Q2H x 12 H   TWI in inferolateral leads: EKG shows nonspecific TWI in inferolateral leads. No priors for comparison. Pt denies chest pain, shortness of breath, diaphoresis, does admit to numbness and weakness in left arm. Pt had an exercise stress test with nuclear imaging in March 2015. Per patient, test was normal. Troponins negative x  3 . Telemetry monitoring showed Irregular rhythm with PVCs. Regular rate 61-78. -EKG pending  -2D echo pending -Telemetry monitoring  -Follow up outpatient Cardiology  HTN: BP 143/73 on admission. Not on any blood pressure medications at home.  -Stable -Consider starting Losartan 25 mg daily  -Monitor   HLD: Lipid panel showed total cholesterol 239, TG 123, HDL 36, LDL 178.  -Atorvastatin 40 mg daily   DM Type II: Glucose was 96 on admission. Pt takes lantus 30 units at night and regular insulin 45 U TID. Hemoglobin A1C 6.7. -Lantus 10 U QHS -SSI-Sensitive TID -CBG 4 times daily  GERD: Pt takes omeprazole 20 mg daily at home. Denies any nausea or reflux symptoms.  -Continue omeprazole 20 mg daily   DVT/PE ppx: SCDs  Dispo: Disposition is deferred at this time, awaiting improvement of current medical problems.  Anticipated discharge in approximately 1-2 day(s).   The patient does have a current PCP Charolett Bumpers, PA-C) and does not need an [facility] hospital follow-up appointment after discharge.  The patient does have transportation limitations that  hinder transportation to clinic appointments.  .Services Needed at time of discharge: Y = Yes, Blank = No PT:   OT:   RN:   Equipment:   Other:     LOS: 1 day   Jill Alexanders, DO PGY-1 Internal Medicine Resident Pager # [phone] 06/19/2014 10:43 AM

## 2014-06-19 NOTE — Progress Notes (Signed)
  Date: 06/19/2014  Patient name: Stephen Mccullough  Medical record number: [id number]  Date of birth: 12-22-1950   This patient has been seen and the plan of care was discussed with the house staff. Please see their note for complete details. I concur with their findings   Judyann Munsonynthia Tage Feggins, MD 06/19/2014, 11:50 AM

## 2014-06-19 NOTE — Progress Notes (Signed)
  Echocardiogram 2D Echocardiogram has been performed.  Arvil ChacoFoster, Francisco Eyerly 06/19/2014, 3:17 PM

## 2014-06-19 NOTE — Progress Notes (Signed)
VASCULAR LAB PRELIMINARY  PRELIMINARY  PRELIMINARY  PRELIMINARY  Carotid Dopplers completed.    Preliminary report:  1-39% ICA stenosis.  Vertebral artery flow is antegrade.   Jennette Leask, RVT 06/19/2014, 4:52 PM
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
## 2014-06-19 NOTE — Progress Notes (Signed)
OT Cancellation Note  Patient Details Name: Stephen Mccullough MRN: [id number] DOB: [birth year]   Cancelled Treatment:    Reason Eval/Treat Not Completed: Patient at procedure or test/ unavailable. OT will follow up later today to assess vision as PT notes pt's mobility is at baseline.   Rae LipsMiller, Jade Burkard M [phone] 06/19/2014, 11:42 AM

## 2014-06-19 NOTE — Progress Notes (Signed)
Stroke Team Progress Note  HISTORY Stephen Mccullough is a 63 y.o. male who had noted over a few days prior to admission he had intermittent numbness and tingling in his left forearm. He stated he had two episodes a day on average. On 06/18/2014 he had just finished mowing a lawn when he stopped at his house. He went to push the clutch with his left foot and noted he had no strength and could not feel the pedal. At the same time he noted his left hand was week and had no strength. He called his brother who came over and helped him out of the car. Within 15 minutes the symptoms resolved.  He takes ASA daily    Date last known well: Date: 06/18/2014  Time last known well: Time: 11:00  tPA Given: No: symptoms fully resolved.     SUBJECTIVE No family at bedside. The patient feels he is back to baseline. Discussed significance of precedingTIAs with high risk of subsequent stroke..  OBJECTIVE Most recent Vital Signs: Filed Vitals:   06/18/14 2200 06/19/14 0008 06/19/14 0200 06/19/14 0400  BP: 129/55 125/67 123/77 125/67  Pulse: 75 65 71 65  Temp: 98.1 F (36.7 C) 97.7 F (36.5 C) 98 F (36.7 C) 97.7 F (36.5 C)  TempSrc: Oral Oral Oral Oral  Resp: 18 18 18 16   Height:      Weight:      SpO2: 98% 99% 100% 99%   CBG (last 3)   Recent Labs  06/18/14 1724 06/18/14 2115 06/19/14 0643  GLUCAP 76 148* 134*    IV Fluid Intake:     MEDICATIONS  . atorvastatin  40 mg Oral q1800  . insulin aspart  0-9 Units Subcutaneous TID WC  . insulin glargine  10 Units Subcutaneous QHS  . pantoprazole  40 mg Oral Daily  . research study medication  100 mg Oral Daily  . research study medication  90 mg Oral BID  . [START ON 06/20/2014] pneumococcal 23 valent vaccine  0.5 mL Intramuscular Tomorrow-1000   PRN:  traMADol  Diet:  Carb Control thin liquids Activity:  Bathroom privileges with assistance DVT Prophylaxis:  SCDs  CLINICALLY SIGNIFICANT STUDIES Basic Metabolic Panel:  Recent Labs Lab  06/18/14 1226  NA 144  K 3.9  CL 106  CO2 25  GLUCOSE 96  BUN 17  CREATININE 1.15  CALCIUM 9.2   Liver Function Tests:  Recent Labs Lab 06/18/14 1226  AST 26  ALT 21  ALKPHOS 109  BILITOT 0.4  PROT 7.9  ALBUMIN 4.3   CBC:  Recent Labs Lab 06/18/14 1226  WBC 9.8  NEUTROABS 5.7  HGB 14.5  HCT 43.5  MCV 85.1  PLT 239   Coagulation:  Recent Labs Lab 06/18/14 1226  LABPROT 13.4  INR 1.02   Cardiac Enzymes:  Recent Labs Lab 06/18/14 1822 06/19/14 0020 06/19/14 0639  TROPONINI <0.30 <0.30 <0.30   Urinalysis: No results found for this basename: COLORURINE, APPERANCEUR, LABSPEC, PHURINE, GLUCOSEU, HGBUR, BILIRUBINUR, KETONESUR, PROTEINUR, UROBILINOGEN, NITRITE, LEUKOCYTESUR,  in the last 168 hours Lipid Panel    Component Value Date/Time   CHOL 239* 06/18/2014 1409   TRIG 123 06/18/2014 1409   HDL 36* 06/18/2014 1409   CHOLHDL 6.6 06/18/2014 1409   VLDL 25 06/18/2014 1409   LDLCALC 178* 06/18/2014 1409   HgbA1C  Lab Results  Component Value Date   HGBA1C 6.7* 06/18/2014    Urine Drug Screen:     Component Value Date/Time  LABOPIA NONE DETECTED 06/18/2014 2157   COCAINSCRNUR NONE DETECTED 06/18/2014 2157   LABBENZ NONE DETECTED 06/18/2014 2157   AMPHETMU NONE DETECTED 06/18/2014 2157   THCU NONE DETECTED 06/18/2014 2157   LABBARB NONE DETECTED 06/18/2014 2157    Alcohol Level: No results found for this basename: ETH,  in the last 168 hours  Ct Head (brain) Wo Contrast 06/18/2014    Chronic small vessel disease throughout the deep white matter. No acute intracranial abnormality.       MRI of the brain - Pending  MRA of the brain - Pending  Carotid Doppler - Pending  2D Echocardiogram - Pending  CXR    EKG - SR rate 75 BPM - For complete results please see formal report.   Therapy Recommendations - No further PT recommended. Filed Vitals:   06/19/14 0924  BP: 125/62  Pulse: 61  Temp: 97.6 F (36.4 C)  Resp: 20    Physical Exam   Mental Status:  Alert,  oriented, thought content appropriate. Speech fluent without evidence of aphasia. Able to follow 3 step commands without difficulty.  Cranial Nerves:  II: Discs not seen; Visual fields grossly normal, pupils equal, round, reactive to light and accommodation  III,IV, VI: ptosis not present, extra-ocular motions intact bilaterally  V,VII: smile symmetric, facial light touch sensation normal bilaterally  VIII: hearing normal bilaterally  IX,X: gag reflex present  XI: bilateral shoulder shrug  XII: midline tongue extension without atrophy or fasciculations  Motor:  Right : Upper extremity 5/5 Left: Upper extremity 5/5  Lower extremity 5/5 Lower extremity 5/5  Tone and bulk:normal tone throughout; no atrophy noted  Sensory: Pinprick and light touch intact throughout, bilaterally  Deep Tendon Reflexes:  Right: Upper Extremity Left: Upper extremity  biceps (C-5 to C-6) 2/4 biceps (C-5 to C-6) 2/4  tricep (C7) 2/4 triceps (C7) 2/4  Brachioradialis (C6) 2/4 Brachioradialis (C6) 2/4  Lower Extremity Lower Extremity  quadriceps (L-2 to L-4) 1/4 quadriceps (L-2 to L-4) 1/4  Achilles (S1) 0/4 Achilles (S1) 0/4  Plantars:  Right: downgoing Left: downgoing  Cerebellar:  normal finger-to-nose, normal heel-to-shin test  Gait: not tested   ASSESSMENT Mr. Stephen Mccullough is a 63 y.o. male presenting with transient left hemiparesis. No TPA - symptoms resolved. Head CT negative.MRI / MRA pending. Probable TIA. On aspirin 81 mg orally every day prior to admission. Now on study drug for secondary stroke prevention. Patient with resultant resolution of deficits. Stroke work up underway.   Participating in Socrates Study  Hyperlipidemia - Chol - 239 ; LDL - 178 - Now on Lipitor (no statin PTA)  Htn  DM - HbgA1C 6.7 (goal < 7)   Hospital day # 1  TREATMENT/PLAN  Continue study drug for secondary stroke prevention.  Await MRI / MRA, echo and carotid dopplers.  Delton See PA-C [facility] Pager [phone] 06/19/2014, 11:16 AM  I have personally examined this patient, reviewed notes, independently viewed imaging studies, participated in medical decision making and plan of care. I have made any additions or clarifications directly to the above note. Agree with note above. He remains at 10-15% risk of recurrent stroke/TIA over next 1 year followed by 4-5% each year after that. I spoke to him at length about need for antiplatelet therapy as well as aggressive risk factor modification henceforth and answered questions.  Delia Heady, MD Medical Director [facility] Pager: [phone] 06/19/2014 12:19 PM SIGNED    To contact Stroke Continuity provider,  please refer to [email or web address] After hours, contact General Neurology

## 2014-06-19 NOTE — H&P (Signed)
  Date: 06/19/2014  Patient name: Stephen Mccullough  Medical record number: [id number]  Date of birth: 04/10/1951   I have seen and evaluated Stephen CashLonnie Cleaver and discussed their care with the Residency Team.   Assessment and Plan: I have seen and evaluated the patient as outlined above. I agree with the formulated Assessment and Plan as detailed in the residents' admission note, with the following changes:   1. Agree with plan to continue to work up as TIA. Appears that his symptoms are much improved, resolved presently Judyann Munsonynthia Anouk Critzer, MD 8/8/201511:46 AM

## 2014-06-20 LAB — GLUCOSE, CAPILLARY
GLUCOSE-CAPILLARY: 106 mg/dL — AB (ref 70–99)
GLUCOSE-CAPILLARY: 129 mg/dL — AB (ref 70–99)
Glucose-Capillary: 157 mg/dL — ABNORMAL HIGH (ref 70–99)
Glucose-Capillary: 167 mg/dL — ABNORMAL HIGH (ref 70–99)

## 2014-06-20 NOTE — Progress Notes (Signed)
Stroke Team Progress Note  HISTORY Stephen Mccullough is a 63 y.o. male who had noted over a few days prior to admission he had intermittent numbness and tingling in his left forearm. He stated he had two episodes a day on average. On 06/18/2014 he had just finished mowing a lawn when he stopped at his house. He went to push the clutch with his left foot and noted he had no strength and could not feel the pedal. At the same time he noted his left hand was week and had no strength. He called his brother who came over and helped him out of the car. Within 15 minutes the symptoms resolved.  He takes ASA daily    Date last known well: Date: 06/18/2014  Time last known well: Time: 11:00  tPA Given: No: symptoms fully resolved.     SUBJECTIVE No family present today. The patient is without complaints. He feels his strength has returned to normal. I briefly discussed the results of the MRI with the patient. I stressed the importance of taking the study drug.  OBJECTIVE Most recent Vital Signs: Filed Vitals:   06/19/14 1738 06/19/14 2156 06/20/14 0105 06/20/14 0534  BP: 158/84 124/64 122/88 117/78  Pulse: 79 71 69 58  Temp: 97.4 F (36.3 C) 98 F (36.7 C) 97.9 F (36.6 C) 97.5 F (36.4 C)  TempSrc: Oral Oral Oral Oral  Resp: 16 18 18 18   Height:      Weight:      SpO2: 98% 98% 98% 99%   CBG (last 3)   Recent Labs  06/19/14 1701 06/19/14 2103 06/20/14 0641  GLUCAP 118* 136* 157*    IV Fluid Intake:     MEDICATIONS  . atorvastatin  40 mg Oral q1800  . insulin aspart  0-9 Units Subcutaneous TID WC  . insulin glargine  10 Units Subcutaneous QHS  . pantoprazole  40 mg Oral Daily  . research study medication  100 mg Oral Daily  . research study medication  90 mg Oral BID  . pneumococcal 23 valent vaccine  0.5 mL Intramuscular Tomorrow-1000   PRN:  traMADol  Diet:  Carb Control thin liquids Activity:  Bathroom privileges with assistance DVT Prophylaxis:  SCDs  CLINICALLY SIGNIFICANT  STUDIES Basic Metabolic Panel:   Recent Labs Lab 06/18/14 1226  NA 144  K 3.9  CL 106  CO2 25  GLUCOSE 96  BUN 17  CREATININE 1.15  CALCIUM 9.2   Liver Function Tests:   Recent Labs Lab 06/18/14 1226  AST 26  ALT 21  ALKPHOS 109  BILITOT 0.4  PROT 7.9  ALBUMIN 4.3   CBC:   Recent Labs Lab 06/18/14 1226  WBC 9.8  NEUTROABS 5.7  HGB 14.5  HCT 43.5  MCV 85.1  PLT 239   Coagulation:   Recent Labs Lab 06/18/14 1226  LABPROT 13.4  INR 1.02   Cardiac Enzymes:   Recent Labs Lab 06/18/14 1822 06/19/14 0020 06/19/14 0639  TROPONINI <0.30 <0.30 <0.30   Urinalysis: No results found for this basename: COLORURINE, APPERANCEUR, LABSPEC, PHURINE, GLUCOSEU, HGBUR, BILIRUBINUR, KETONESUR, PROTEINUR, UROBILINOGEN, NITRITE, LEUKOCYTESUR,  in the last 168 hours Lipid Panel    Component Value Date/Time   CHOL 239* 06/18/2014 1409   TRIG 123 06/18/2014 1409   HDL 36* 06/18/2014 1409   CHOLHDL 6.6 06/18/2014 1409   VLDL 25 06/18/2014 1409   LDLCALC 178* 06/18/2014 1409   HgbA1C  Lab Results  Component Value Date   HGBA1C  6.7* 06/18/2014    Urine Drug Screen:     Component Value Date/Time   LABOPIA NONE DETECTED 06/18/2014 2157   COCAINSCRNUR NONE DETECTED 06/18/2014 2157   LABBENZ NONE DETECTED 06/18/2014 2157   AMPHETMU NONE DETECTED 06/18/2014 2157   THCU NONE DETECTED 06/18/2014 2157   LABBARB NONE DETECTED 06/18/2014 2157    Alcohol Level: No results found for this basename: ETH,  in the last 168 hours  Ct Head (brain) Wo Contrast 06/18/2014    Chronic small vessel disease throughout the deep white matter. No acute intracranial abnormality.       MRI of the brain  06/19/2014 1. Small acute infarct in the right MCA pre motor area affecting white matter. No mass effect or hemorrhage.  2. Questionable small acute cortically based infarct in the right occipital lobe (right PCA territory).  3. Underlying chronic small vessel disease.   MRA of the brain  06/19/2014 1. 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  2. No major right MCA branch occlusion identified. Otherwise negative anterior circulation.  3. 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 Otherwise negative posterior circulation.    Carotid Doppler - Preliminary report: 1-39% ICA stenosis. Vertebral artery flow is antegrade.    2D Echocardiogram - ejection fraction 55-60%. No cardiac source of emboli identified.  CXR    EKG - SR rate 75 BPM - For complete results please see formal report.   Therapy Recommendations - no further physical or occupational therapy recommended.   Filed Vitals:   06/20/14 0534  BP: 117/78  Pulse: 58  Temp: 97.5 F (36.4 C)  Resp: 18    Physical Exam   Mental Status:  Alert, oriented, thought content appropriate. Speech fluent without evidence of aphasia. Able to follow 3 step commands without difficulty.  Cranial Nerves:  II: Discs not seen; Visual fields grossly normal, pupils equal, round, reactive to light and accommodation  III,IV, VI: ptosis not present, extra-ocular motions intact bilaterally  V,VII: smile symmetric, facial light touch sensation normal bilaterally  VIII: hearing normal bilaterally  IX,X: gag reflex present  XI: bilateral shoulder shrug  XII: midline tongue extension without atrophy or fasciculations  Motor:  Right : Upper extremity 5/5 Left: Upper extremity 5/5  Lower extremity 5/5 Lower extremity 5/5  Tone and bulk:normal tone throughout; no atrophy noted  Sensory: Pinprick and light touch intact throughout, bilaterally  Deep Tendon Reflexes:  Right: Upper Extremity Left: Upper extremity  biceps (C-5 to C-6) 2/4 biceps (C-5 to C-6) 2/4  tricep (C7) 2/4 triceps (C7) 2/4  Brachioradialis (C6) 2/4 Brachioradialis (C6) 2/4  Lower Extremity Lower Extremity  quadriceps (L-2  to L-4) 1/4 quadriceps (L-2 to L-4) 1/4  Achilles (S1) 0/4 Achilles (S1) 0/4  Plantars:  Right: downgoing Left: downgoing  Cerebellar:  normal finger-to-nose, normal heel-to-shin test  Gait: not tested   ASSESSMENT Mr. Stephen Mccullough is a 63 y.o. male presenting with transient left hemiparesis. No TPA - symptoms resolved. Head CT negative. MRI - small acute infarct in the right MCA pre motor area affecting white matter. Questionable small acute cortically based infarct in the right occipital lobe (right PCA territory). 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 Embolic source unknown. On aspirin 81 mg orally every day prior to admission. Now on study drug for secondary stroke prevention. Patient with resultant resolution of deficits. Stroke work up underway.  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  No followup therapy recommended.  Recommend TEE and loop placement on Monday to look for source of emboli.  Discussed with Dr. Leone Brand Rinehuls PA-C [facility] Pager [phone] 06/20/2014, 8:22 AM  I have personally examined this patient, reviewed notes, independently viewed imaging studies, participated in medical decision making and plan of care. I have made any additions or clarifications directly to the above note. Agree with note above.   SIGNED    To contact Stroke Continuity provider, please refer to [email or web address]. After hours, contact General Neurology

## 2014-06-20 NOTE — Progress Notes (Signed)
Subjective: Feels well today. No new neurologic symptoms.   Objective: Vital signs in last 24 hours: Filed Vitals:   06/19/14 2156 06/20/14 0105 06/20/14 0534 06/20/14 1005  BP: 124/64 122/88 117/78 145/70  Pulse: 71 69 58 76  Temp: 98 F (36.7 C) 97.9 F (36.6 C) 97.5 F (36.4 C) 97.5 F (36.4 C)  TempSrc: Oral Oral Oral Oral  Resp: 18 18 18 20   Height:      Weight:      SpO2: 98% 98% 99% 98%   Weight change:  No intake or output data in the 24 hours ending 06/20/14 1320  Filed Vitals:   06/19/14 2156 06/20/14 0105 06/20/14 0534 06/20/14 1005  BP: 124/64 122/88 117/78 145/70  Pulse: 71 69 58 76  Temp: 98 F (36.7 C) 97.9 F (36.6 C) 97.5 F (36.4 C) 97.5 F (36.4 C)  TempSrc: Oral Oral Oral Oral  Resp: 18 18 18 20   Height:      Weight:      SpO2: 98% 98% 99% 98%   General: Vital signs reviewed. No distress. Seated in chair, having breakfast. Lungs: Clear to auscultation bilaterally  Heart: irregular rhythm. no extra sounds or murmurs  Abdomen: Bowel sounds present, soft, nontender; no hepatosplenomegaly  Extremities: No bilateral ankle edema  Neurologic: Alert and oriented x3. Moves all extremities with normal strength. Lab Results: Basic Metabolic Panel:  Recent Labs Lab 06/18/14 1226  NA 144  K 3.9  CL 106  CO2 25  GLUCOSE 96  BUN 17  CREATININE 1.15  CALCIUM 9.2   Liver Function Tests:  Recent Labs Lab 06/18/14 1226  AST 26  ALT 21  ALKPHOS 109  BILITOT 0.4  PROT 7.9  ALBUMIN 4.3   CBC:  Recent Labs Lab 06/18/14 1226  WBC 9.8  NEUTROABS 5.7  HGB 14.5  HCT 43.5  MCV 85.1  PLT 239   Cardiac Enzymes:  Recent Labs Lab 06/18/14 1822 06/19/14 0020 06/19/14 0639  TROPONINI <0.30 <0.30 <0.30   CBG:  Recent Labs Lab 06/19/14 0643 06/19/14 1228 06/19/14 1701 06/19/14 2103 06/20/14 0641 06/20/14 1134  GLUCAP 134* 123* 118* 136* 157* 106*   Hemoglobin A1C:  Recent Labs Lab 06/18/14 1226  HGBA1C 6.7*   Fasting  Lipid Panel:  Recent Labs Lab 06/18/14 1409  CHOL 239*  HDL 36*  LDLCALC 178*  TRIG 123  CHOLHDL 6.6   Coagulation:  Recent Labs Lab 06/18/14 1226  LABPROT 13.4  INR 1.02   Urine Drug Screen: Drugs of Abuse     Component Value Date/Time   LABOPIA NONE DETECTED 06/18/2014 2157   COCAINSCRNUR NONE DETECTED 06/18/2014 2157   LABBENZ NONE DETECTED 06/18/2014 2157   AMPHETMU NONE DETECTED 06/18/2014 2157   THCU NONE DETECTED 06/18/2014 2157   LABBARB NONE DETECTED 06/18/2014 2157    Studies/Results: Mr Maxine GlennMra Head Wo Contrast  06/19/2014   CLINICAL DATA:  63 year old male with left side weakness and numbness plus slurred speech. Symptoms resolved after 15 min. Initial encounter.  EXAM: MRI HEAD WITHOUT CONTRAST  MRA HEAD WITHOUT CONTRAST  TECHNIQUE: Multiplanar, multiecho pulse sequences of the brain and surrounding structures were obtained without intravenous contrast. Angiographic images of the head were obtained using MRA technique without contrast.  COMPARISON:  Head CT without contrast 06/18/2014.  FINDINGS: MRI HEAD FINDINGS  Wallace CullensGray and white matter signal is within normal limits for age throughout the brain. 2 cm area of confluent restricted diffusion in the right superior frontal gyrus white matter (  subcortical and more central) corresponding to the pre motor region (series 8, image 19, series 5, image 15). No associated mass effect or hemorrhage.  No contralateral restricted diffusion. Questionable small focus of right occipital lobe cortical restricted diffusion on series 11, image 19. No posterior fossa restricted diffusion. Major intracranial vascular flow voids are preserved.  Patchy and confluent bilateral cerebral white matter T2 and FLAIR hyperintensity in addition to the acute finding above, in areas most resembles chronic lacunar infarcts (right atrium). There are occasional chronic micro hemorrhages in the brain (left parieto-occipital sulcus). No cortical encephalomalacia identified.  Several chronic lacunar infarcts in the deep gray matter nuclei including the left thalamus. Brainstem and cerebellum within normal limits.  No midline shift, mass effect, evidence of mass lesion, ventriculomegaly, extra-axial collection or acute intracranial hemorrhage. Cervicomedullary junction and pituitary are within normal limits. Negative visualized cervical spine. Normal bone marrow signal Visible internal auditory structures appear normal. Visualized orbit soft tissues are within normal limits. Visualized paranasal sinuses and mastoids are clear. Visualized scalp soft tissues are within normal limits.  MRA HEAD FINDINGS  Antegrade flow in the posterior circulation with codominant distal vertebral arteries. Normal vertebrobasilar junction. Basilar artery irregularity, but no hemodynamically significant basilar artery stenosis. SCA and right PCA origins are within normal limits. Fetal type left PCA origin. Small right posterior communicating artery. Bilateral PCA branches are within normal limits.  Antegrade flow in both ICA siphons. Tortuous distal cervical ICA is greater on the right. No ICA siphon stenosis. Ophthalmic and posterior communicating artery origins are within normal limits. Patent carotid termini. Normal ACA and left MCA origins. Anterior communicating artery diminutive or absent. Visualized bilateral ACA and left MCA branches are within normal limits.  Just be on the right MCA origin there is a short 2 mm segment of irregularity and high-grade stenosis (series 605, image 7). The M1 segment remains patent. The right MCA bifurcation is patent. No major right MCA branch occlusion is identified.  IMPRESSION: MRI BRAIN:  1. Small acute infarct in the right MCA pre motor area affecting white matter. No mass effect or hemorrhage. 2. Questionable small acute cortically based infarct in the right occipital lobe (right PCA territory). 3. Underlying chronic small vessel disease.  MRA BRAIN:  1. Two mm  high-grade stenosis of the right MCA proximal M1 segment compatible with thromboembolic disease in this setting, and congruent with the acute findings in #1 above. 2. No major right MCA branch occlusion identified. Otherwise negative anterior circulation. 3. 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 Otherwise negative posterior circulation.   Electronically Signed   By: Augusto Gamble M.D.   On: 06/19/2014 13:45   Mr Brain Wo Contrast  06/19/2014   CLINICAL DATA:  64 year old male with left side weakness and numbness plus slurred speech. Symptoms resolved after 15 min. Initial encounter.  EXAM: MRI HEAD WITHOUT CONTRAST  MRA HEAD WITHOUT CONTRAST  TECHNIQUE: Multiplanar, multiecho pulse sequences of the brain and surrounding structures were obtained without intravenous contrast. Angiographic images of the head were obtained using MRA technique without contrast.  COMPARISON:  Head CT without contrast 06/18/2014.  FINDINGS: MRI HEAD FINDINGS  Wallace Cullens and white matter signal is within normal limits for age throughout the brain. 2 cm area of confluent restricted diffusion in the right superior frontal gyrus white matter (subcortical and more central) corresponding to the pre motor region (series 8, image 19, series 5, image 15). No associated mass effect or hemorrhage.  No contralateral restricted diffusion. Questionable small  focus of right occipital lobe cortical restricted diffusion on series 11, image 19. No posterior fossa restricted diffusion. Major intracranial vascular flow voids are preserved.  Patchy and confluent bilateral cerebral white matter T2 and FLAIR hyperintensity in addition to the acute finding above, in areas most resembles chronic lacunar infarcts (right atrium). There are occasional chronic micro hemorrhages in the brain (left parieto-occipital sulcus). No cortical encephalomalacia identified. Several chronic lacunar infarcts in the deep gray matter nuclei including  the left thalamus. Brainstem and cerebellum within normal limits.  No midline shift, mass effect, evidence of mass lesion, ventriculomegaly, extra-axial collection or acute intracranial hemorrhage. Cervicomedullary junction and pituitary are within normal limits. Negative visualized cervical spine. Normal bone marrow signal Visible internal auditory structures appear normal. Visualized orbit soft tissues are within normal limits. Visualized paranasal sinuses and mastoids are clear. Visualized scalp soft tissues are within normal limits.  MRA HEAD FINDINGS  Antegrade flow in the posterior circulation with codominant distal vertebral arteries. Normal vertebrobasilar junction. Basilar artery irregularity, but no hemodynamically significant basilar artery stenosis. SCA and right PCA origins are within normal limits. Fetal type left PCA origin. Small right posterior communicating artery. Bilateral PCA branches are within normal limits.  Antegrade flow in both ICA siphons. Tortuous distal cervical ICA is greater on the right. No ICA siphon stenosis. Ophthalmic and posterior communicating artery origins are within normal limits. Patent carotid termini. Normal ACA and left MCA origins. Anterior communicating artery diminutive or absent. Visualized bilateral ACA and left MCA branches are within normal limits.  Just be on the right MCA origin there is a short 2 mm segment of irregularity and high-grade stenosis (series 605, image 7). The M1 segment remains patent. The right MCA bifurcation is patent. No major right MCA branch occlusion is identified.  IMPRESSION: MRI BRAIN:  1. Small acute infarct in the right MCA pre motor area affecting white matter. No mass effect or hemorrhage. 2. Questionable small acute cortically based infarct in the right occipital lobe (right PCA territory). 3. Underlying chronic small vessel disease.  MRA BRAIN:  1. Two mm high-grade stenosis of the right MCA proximal M1 segment compatible with  thromboembolic disease in this setting, and congruent with the acute findings in #1 above. 2. No major right MCA branch occlusion identified. Otherwise negative anterior circulation. 3. 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 Otherwise negative posterior circulation.   Electronically Signed   By: Augusto Gamble M.D.   On: 06/19/2014 13:45   Medications: I have reviewed the patient's current medications.  Prescriptions prior to admission  Medication Sig Dispense Refill  . albuterol (PROVENTIL HFA;VENTOLIN HFA) 108 (90 BASE) MCG/ACT inhaler Inhale 1-2 puffs into the lungs every 6 (six) hours as needed for wheezing or shortness of breath.      Marland Kitchen aspirin EC 81 MG tablet Take 81 mg by mouth daily.      . insulin glargine (LANTUS) 100 UNIT/ML injection Inject 30 Units into the skin at bedtime.      . insulin regular (NOVOLIN R,HUMULIN R) 100 units/mL injection Inject 45 Units into the skin 3 (three) times daily before meals.      Marland Kitchen lisinopril (PRINIVIL,ZESTRIL) 20 MG tablet Take 20 mg by mouth daily.      Marland Kitchen omeprazole (PRILOSEC) 20 MG capsule Take 20 mg by mouth daily.      . traMADol (ULTRAM) 50 MG tablet Take 50 mg by mouth every 6 (six) hours as needed for moderate pain.  Scheduled Meds: . atorvastatin  40 mg Oral q1800  . insulin aspart  0-9 Units Subcutaneous TID WC  . insulin glargine  10 Units Subcutaneous QHS  . pantoprazole  40 mg Oral Daily  . research study medication  100 mg Oral Daily  . research study medication  90 mg Oral BID  . pneumococcal 23 valent vaccine  0.5 mL Intramuscular Tomorrow-1000   Continuous Infusions:  PRN Meds:.traMADol Assessment/Plan:  Stephen Mccullough is a 63 yo male with PMHx of HTN, GERD, Asthma and DM type II who presented to the ED with complaint of left sided weakness and numbness and slurred speech which lasted for 15 minutes and resolved.   Acute ischemic stroke: Symptoms have remained unchanged. He has good strength  on neurologic exam. Head CT scan was unremarkable. Brain MRI revealed right MCA infarct. Brain MRA revealed high-grade stenosis of the right MCA raising the concern of thromboembolic disease. Echocardiogram marked for grade 1 diastolic dysfunction, otherwise, normal. Carotid Dopplers unremarkable. Plan -Per neurology patient will require TEE. consulted cardiology. -If the TEE is unrevealing, patient required a loop recorder - patient is on Study medication for antiplatelet therapy. -Telemetry monitoring  -Neurology following -Diet carb modified   TWI in inferolateral leads: EKG shows nonspecific TWI in inferolateral leads. No priors for comparison. Patient is asymptomatic. Pt had an exercise stress test with nuclear imaging in March 2015. Per patient, test was normal. Troponins negative x 3 . Telemetry monitoring showed Irregular rhythm with PVCs. Regular rate 61-78. Plan -Telemetry monitoring  -Follow up outpatient Cardiology  HTN: BP 143/73 on admission. Not on any blood pressure medications at home.  -Stable -Will restart Losartan 25 mg daily after 48 hours since his symptoms of stroke.   HLD: Lipid panel showed total cholesterol 239, TG 123, HDL 36, LDL 178.  -Atorvastatin 40 mg daily   DM Type II: Pt takes lantus 30 units at night and regular insulin 45 U TID. Hemoglobin A1C 6.7. -Lantus 10 U QHS -SSI-Sensitive TID -CBG 4 times daily  GERD: Pt takes omeprazole 20 mg daily at home. Denies any nausea or reflux symptoms.  -Continue omeprazole 20 mg daily   DVT/PE ppx: SCDs  Dispo: Disposition is deferred at this time, awaiting improvement of current medical problems. Patient will need to keep him in-house for completion of a stroke workup, including TEE and loop recorder placement.  The patient does have a current PCP Charolett Bumpers, PA-C) and does not need an [facility] hospital follow-up appointment after discharge.  The patient does have transportation limitations that hinder  transportation to clinic appointments.  .Services Needed at time of discharge: Y = Yes, Blank = No PT:   OT:   RN:   Equipment:   Other:     LOS: 2 days   Signed:  Dow Adolph, MD PGY-3 Internal Medicine Teaching Service Pager: [phone] 06/20/2014, 1:20 PM

## 2014-06-21 ENCOUNTER — Encounter (HOSPITAL_COMMUNITY)
Admission: EM | Disposition: A | Discharge status: Home / Self Care | Payer: Self-pay | Source: Home / Self Care | Attending: Internal Medicine

## 2014-06-21 ENCOUNTER — Encounter (HOSPITAL_COMMUNITY): Payer: Self-pay | Admitting: *Deleted

## 2014-06-21 DIAGNOSIS — I6350 Cerebral infarction due to unspecified occlusion or stenosis of unspecified cerebral artery: Principal | ICD-10-CM

## 2014-06-21 DIAGNOSIS — I059 Rheumatic mitral valve disease, unspecified: Secondary | ICD-10-CM

## 2014-06-21 HISTORY — PX: TEE WITHOUT CARDIOVERSION: SHX5443

## 2014-06-21 LAB — BASIC METABOLIC PANEL
Anion gap: 12 (ref 5–15)
BUN: 18 mg/dL (ref 6–23)
CHLORIDE: 104 meq/L (ref 96–112)
CO2: 25 mEq/L (ref 19–32)
CREATININE: 1.23 mg/dL (ref 0.50–1.35)
Calcium: 9.1 mg/dL (ref 8.4–10.5)
GFR calc non Af Amer: 61 mL/min — ABNORMAL LOW (ref >90)
GFR, EST AFRICAN AMERICAN: 71 mL/min — AB (ref >90)
GLUCOSE: 117 mg/dL — AB (ref 70–99)
POTASSIUM: 4.2 meq/L (ref 3.7–5.3)
Sodium: 141 mEq/L (ref 137–147)

## 2014-06-21 LAB — CBC
HCT: 42.6 % (ref 39.0–52.0)
Hemoglobin: 14.4 g/dL (ref 13.0–17.0)
MCH: 28.3 pg (ref 26.0–34.0)
MCHC: 33.8 g/dL (ref 30.0–36.0)
MCV: 83.9 fL (ref 78.0–100.0)
Platelets: 213 10*3/uL (ref 150–400)
RBC: 5.08 MIL/uL (ref 4.22–5.81)
RDW: 13.3 % (ref 11.5–15.5)
WBC: 10.4 10*3/uL (ref 4.0–10.5)

## 2014-06-21 LAB — GLUCOSE, CAPILLARY
GLUCOSE-CAPILLARY: 120 mg/dL — AB (ref 70–99)
Glucose-Capillary: 94 mg/dL (ref 70–99)
Glucose-Capillary: 118 mg/dL — ABNORMAL HIGH (ref 70–99)
Glucose-Capillary: 126 mg/dL — ABNORMAL HIGH (ref 70–99)
Glucose-Capillary: 158 mg/dL — ABNORMAL HIGH (ref 70–99)

## 2014-06-21 SURGERY — ECHOCARDIOGRAM, TRANSESOPHAGEAL
Anesthesia: Moderate Sedation

## 2014-06-21 MED ORDER — DIPHENHYDRAMINE HCL 50 MG/ML IJ SOLN
INTRAMUSCULAR | Status: AC
  Filled 2014-06-21: qty 1

## 2014-06-21 MED ORDER — SODIUM CHLORIDE 0.9 % IV SOLN: INTRAVENOUS | Status: DC

## 2014-06-21 MED ORDER — MIDAZOLAM HCL 5 MG/ML IJ SOLN
INTRAMUSCULAR | Status: AC
  Filled 2014-06-21: qty 2

## 2014-06-21 MED ORDER — BUTAMBEN-TETRACAINE-BENZOCAINE 2-2-14 % EX AERO
INHALATION_SPRAY | CUTANEOUS | Status: DC | PRN
  Administered 2014-06-21: 2 via TOPICAL

## 2014-06-21 MED ORDER — MIDAZOLAM HCL 10 MG/2ML IJ SOLN
INTRAMUSCULAR | Status: DC | PRN
  Administered 2014-06-21: 1 mg via INTRAVENOUS
  Administered 2014-06-21: 2 mg via INTRAVENOUS
  Administered 2014-06-21 (×2): 1 mg via INTRAVENOUS
  Administered 2014-06-21: 2 mg via INTRAVENOUS
  Administered 2014-06-21: 1 mg via INTRAVENOUS

## 2014-06-21 MED ORDER — FENTANYL CITRATE 0.05 MG/ML IJ SOLN
INTRAMUSCULAR | Status: AC
  Filled 2014-06-21: qty 2

## 2014-06-21 MED ORDER — FENTANYL CITRATE 0.05 MG/ML IJ SOLN
INTRAMUSCULAR | Status: DC | PRN
  Administered 2014-06-21 (×2): 25 ug via INTRAVENOUS

## 2014-06-21 NOTE — Progress Notes (Signed)
Internal Medicine Attending  Date: 06/21/2014  Patient name: Stephen Mccullough Medical record number: [id number] Date of birth: [birth year] Age: 63 y.o. Gender: male  I saw and evaluated the patient, and discussed his care with resident on A.M rounds.  I reviewed the resident's note by Dr. Senaida Oresichardson and I agree with the resident's findings and plans as documented in her note.

## 2014-06-21 NOTE — Progress Notes (Signed)
Stroke Team Progress Note  HISTORY Stephen Mccullough is a 63 y.o. male who had noted over a few days prior to admission he had intermittent numbness and tingling in his left forearm. He stated he had two episodes a day on average. On 06/18/2014 he had just finished mowing a lawn when he stopped at his house. He went to push the clutch with his left foot and noted he had no strength and could not feel the pedal. At the same time he noted his left hand was week and had no strength. He called his brother who came over and helped him out of the car. Within 15 minutes the symptoms resolved.  He takes ASA daily    Date last known well: Date: 06/18/2014  Time last known well: Time: 11:00  tPA Given: No: symptoms fully resolved.     SUBJECTIVE No family present today. The patient is without complaints. He feels his strength has returned to normal.TEE not done as patient ate and is scheduled for tomorrow OBJECTIVE Most recent Vital Signs: Filed Vitals:   06/20/14 2055 06/21/14 0204 06/21/14 0640 06/21/14 1001  BP: 153/85 145/85 147/77 151/71  Pulse: 71 65 66 64  Temp: 98 F (36.7 C) 98.2 F (36.8 C) 97.7 F (36.5 C) 97.9 F (36.6 C)  TempSrc: Oral Oral Oral Oral  Resp: 20 18 18 18   Height:      Weight:      SpO2: 99% 100% 98% 98%   CBG (last 3)   Recent Labs  06/20/14 2057 06/21/14 0655 06/21/14 1136  GLUCAP 167* 118* 126*    IV Fluid Intake:     MEDICATIONS  . atorvastatin  40 mg Oral q1800  . insulin aspart  0-9 Units Subcutaneous TID WC  . insulin glargine  10 Units Subcutaneous QHS  . pantoprazole  40 mg Oral Daily  . research study medication  100 mg Oral Daily  . research study medication  90 mg Oral BID   PRN:  traMADol  Diet:    thin liquids Activity:  Bathroom privileges with assistance DVT Prophylaxis:  SCDs  CLINICALLY SIGNIFICANT STUDIES Basic Metabolic Panel:   Recent Labs Lab 06/18/14 1226 06/21/14 0500  NA 144 141  K 3.9 4.2  CL 106 104  CO2 25 25   GLUCOSE 96 117*  BUN 17 18  CREATININE 1.15 1.23  CALCIUM 9.2 9.1   Liver Function Tests:   Recent Labs Lab 06/18/14 1226  AST 26  ALT 21  ALKPHOS 109  BILITOT 0.4  PROT 7.9  ALBUMIN 4.3   CBC:   Recent Labs Lab 06/18/14 1226 06/21/14 0500  WBC 9.8 10.4  NEUTROABS 5.7  --   HGB 14.5 14.4  HCT 43.5 42.6  MCV 85.1 83.9  PLT 239 213   Coagulation:   Recent Labs Lab 06/18/14 1226  LABPROT 13.4  INR 1.02   Cardiac Enzymes:   Recent Labs Lab 06/18/14 1822 06/19/14 0020 06/19/14 0639  TROPONINI <0.30 <0.30 <0.30   Urinalysis: No results found for this basename: COLORURINE, APPERANCEUR, LABSPEC, PHURINE, GLUCOSEU, HGBUR, BILIRUBINUR, KETONESUR, PROTEINUR, UROBILINOGEN, NITRITE, LEUKOCYTESUR,  in the last 168 hours Lipid Panel    Component Value Date/Time   CHOL 239* 06/18/2014 1409   TRIG 123 06/18/2014 1409   HDL 36* 06/18/2014 1409   CHOLHDL 6.6 06/18/2014 1409   VLDL 25 06/18/2014 1409   LDLCALC 178* 06/18/2014 1409   HgbA1C  Lab Results  Component Value Date   HGBA1C 6.7* 06/18/2014  Urine Drug Screen:     Component Value Date/Time   LABOPIA NONE DETECTED 06/18/2014 2157   COCAINSCRNUR NONE DETECTED 06/18/2014 2157   LABBENZ NONE DETECTED 06/18/2014 2157   AMPHETMU NONE DETECTED 06/18/2014 2157   THCU NONE DETECTED 06/18/2014 2157   LABBARB NONE DETECTED 06/18/2014 2157    Alcohol Level: No results found for this basename: ETH,  in the last 168 hours  Ct Head (brain) Wo Contrast 06/18/2014    Chronic small vessel disease throughout the deep white matter. No acute intracranial abnormality.       MRI of the brain  06/19/2014 1. Small acute infarct in the right MCA pre motor area affecting white matter. No mass effect or hemorrhage.  2. Questionable small acute cortically based infarct in the right occipital lobe (right PCA territory).  3. Underlying chronic small vessel disease.   MRA of the brain  06/19/2014 1. Two mm high-grade stenosis of the right MCA  proximal M1 segment compatible with thromboembolic disease in this setting, and congruent with the acute findings in #1 above.  2. No major right MCA branch occlusion identified. Otherwise negative anterior circulation.  3. 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 Otherwise negative posterior circulation.    Carotid Doppler - Preliminary report: 1-39% ICA stenosis. Vertebral artery flow is antegrade.    2D Echocardiogram - ejection fraction 55-60%. No cardiac source of emboli identified.  CXR    EKG - SR rate 75 BPM - For complete results please see formal report.   Therapy Recommendations - no further physical or occupational therapy recommended.   Filed Vitals:   06/21/14 1001  BP: 151/71  Pulse: 64  Temp: 97.9 F (36.6 C)  Resp: 18    Physical Exam   Mental Status:  Alert, oriented, thought content appropriate. Speech fluent without evidence of aphasia. Able to follow 3 step commands without difficulty.  Cranial Nerves:  II: Discs not seen; Visual fields grossly normal, pupils equal, round, reactive to light and accommodation  III,IV, VI: ptosis not present, extra-ocular motions intact bilaterally  V,VII: smile symmetric, facial light touch sensation normal bilaterally  VIII: hearing normal bilaterally  IX,X: gag reflex present  XI: bilateral shoulder shrug  XII: midline tongue extension without atrophy or fasciculations  Motor:  Right : Upper extremity 5/5 Left: Upper extremity 5/5  Lower extremity 5/5 Lower extremity 5/5  Tone and bulk:normal tone throughout; no atrophy noted  Sensory: Pinprick and light touch intact throughout, bilaterally  Deep Tendon Reflexes:  Right: Upper Extremity Left: Upper extremity  biceps (C-5 to C-6) 2/4 biceps (C-5 to C-6) 2/4  tricep (C7) 2/4 triceps (C7) 2/4  Brachioradialis (C6) 2/4 Brachioradialis (C6) 2/4  Lower Extremity Lower Extremity  quadriceps (L-2 to L-4) 1/4 quadriceps (L-2 to L-4) 1/4   Achilles (S1) 0/4 Achilles (S1) 0/4  Plantars:  Right: downgoing Left: downgoing  Cerebellar:  normal finger-to-nose, normal heel-to-shin test  Gait: not tested   ASSESSMENT Stephen Mccullough is a 63 y.o. male presenting with transient left hemiparesis. No TPA - symptoms resolved. Head CT negative. MRI - small acute infarct in the right MCA pre motor area affecting white matter. Questionable small acute cortically based infarct in the right occipital lobe (right PCA territory). 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 Embolic source unknown. On aspirin 81 mg orally every day prior to admission. Now on study drug for secondary stroke prevention. Patient with resultant resolution of deficits. Stroke work up underway.   Participating in [employer] [facility]  Hyperlipidemia - Chol - 239 ; LDL - 178 - Now on Lipitor (no statin PTA)  Htn  DM - HbgA1C 6.7 (goal < 7)  MRA - Two mm high-grade stenosis of the right MCA proximal M1 segment compatible with thromboembolic disease   Hospital day # 3  TREATMENT/PLAN  Continue study drug for secondary stroke prevention.  No followup therapy recommended.  Recommend TEE and loop placement tomorrow to look for source of emboli.  Discussed with Dr. Ardine Eng, MD        To contact Stroke Continuity provider, please refer to [email or web address]. After hours, contact General Neurology

## 2014-06-21 NOTE — Progress Notes (Signed)
MD notified of pt saved telemetry strips during shift and MD voices he will look at the strips. Reported off to incoming RN. Arabella MerlesP. Amo Korion Cuevas RN.

## 2014-06-21 NOTE — Progress Notes (Addendum)
Subjective:  Patient was seen and examined this morning. He denies any complaints. Unfortunately, patient had eaten breakfast and therefore TEE will be delayed. He denies any headache, weakness, dizziness, confusion, chest pain or palpitations.   Objective: Vital signs in last 24 hours: Filed Vitals:   06/20/14 2055 06/21/14 0204 06/21/14 0640 06/21/14 1001  BP: 153/85 145/85 147/77 151/71  Pulse: 71 65 66 64  Temp: 98 F (36.7 C) 98.2 F (36.8 C) 97.7 F (36.5 C) 97.9 F (36.6 C)  TempSrc: Oral Oral Oral Oral  Resp: 20 18 18 18   Height:      Weight:      SpO2: 99% 100% 98% 98%   Filed Vitals:   06/20/14 2055 06/21/14 0204 06/21/14 0640 06/21/14 1001  BP: 153/85 145/85 147/77 151/71  Pulse: 71 65 66 64  Temp: 98 F (36.7 C) 98.2 F (36.8 C) 97.7 F (36.5 C) 97.9 F (36.6 C)  TempSrc: Oral Oral Oral Oral  Resp: 20 18 18 18   Height:      Weight:      SpO2: 99% 100% 98% 98%   General: Vital signs reviewed.  Patient is well-developed and well-nourished, in no acute distress and cooperative with exam.  Neck: No carotid bruit present.  Cardiovascular: Irregular rhythm, regular rate, S1 normal, S2 normal, no murmurs, gallops, or rubs. Pulmonary/Chest: Clear to auscultation bilaterally, no wheezes, rales, or rhonchi. Abdominal: Soft, non-tender, non-distended, BS +, no masses, organomegaly, or guarding present.  Musculoskeletal: No joint deformities, erythema, or stiffness, ROM full and nontender. Extremities: No lower extremity edema bilaterally,  pulses symmetric and intact bilaterally. No cyanosis or clubbing. Neurological: AA&O x3, Strength is normal and symmetric bilaterally 5/5, cranial nerve II-XII are grossly intact, no focal motor deficit, sensory intact to light touch bilaterally.  Skin: Warm, dry and intact. No rashes or erythema. Psychiatric: Normal mood and affect. speech and behavior is normal. Cognition and memory are normal.   Lab Results:  Basic Metabolic  Panel:  Recent Labs Lab 06/18/14 1226 06/21/14 0500  NA 144 141  K 3.9 4.2  CL 106 104  CO2 25 25  GLUCOSE 96 117*  BUN 17 18  CREATININE 1.15 1.23  CALCIUM 9.2 9.1   Liver Function Tests:  Recent Labs Lab 06/18/14 1226  AST 26  ALT 21  ALKPHOS 109  BILITOT 0.4  PROT 7.9  ALBUMIN 4.3   CBC:  Recent Labs Lab 06/18/14 1226 06/21/14 0500  WBC 9.8 10.4  NEUTROABS 5.7  --   HGB 14.5 14.4  HCT 43.5 42.6  MCV 85.1 83.9  PLT 239 213   Cardiac Enzymes:  Recent Labs Lab 06/18/14 1822 06/19/14 0020 06/19/14 0639  TROPONINI <0.30 <0.30 <0.30   CBG:  Recent Labs Lab 06/20/14 0641 06/20/14 1134 06/20/14 1631 06/20/14 2057 06/21/14 0655 06/21/14 1136  GLUCAP 157* 106* 129* 167* 118* 126*   Hemoglobin A1C:  Recent Labs Lab 06/18/14 1226  HGBA1C 6.7*   Fasting Lipid Panel:  Recent Labs Lab 06/18/14 1409  CHOL 239*  HDL 36*  LDLCALC 178*  TRIG 123  CHOLHDL 6.6   Coagulation:  Recent Labs Lab 06/18/14 1226  LABPROT 13.4  INR 1.02   Urine Drug Screen: Drugs of Abuse     Component Value Date/Time   LABOPIA NONE DETECTED 06/18/2014 2157   COCAINSCRNUR NONE DETECTED 06/18/2014 2157   LABBENZ NONE DETECTED 06/18/2014 2157   AMPHETMU NONE DETECTED 06/18/2014 2157   THCU NONE DETECTED 06/18/2014 2157  LABBARB NONE DETECTED 06/18/2014 2157    Studies/Results: Mr Shirlee Latch Wo Contrast  06/19/2014   CLINICAL DATA:  63 year old male with left side weakness and numbness plus slurred speech. Symptoms resolved after 15 min. Initial encounter.  EXAM: MRI HEAD WITHOUT CONTRAST  MRA HEAD WITHOUT CONTRAST  TECHNIQUE: Multiplanar, multiecho pulse sequences of the brain and surrounding structures were obtained without intravenous contrast. Angiographic images of the head were obtained using MRA technique without contrast.  COMPARISON:  Head CT without contrast 06/18/2014.  FINDINGS: MRI HEAD FINDINGS  Wallace Cullens and white matter signal is within normal limits for age  throughout the brain. 2 cm area of confluent restricted diffusion in the right superior frontal gyrus white matter (subcortical and more central) corresponding to the pre motor region (series 8, image 19, series 5, image 15). No associated mass effect or hemorrhage.  No contralateral restricted diffusion. Questionable small focus of right occipital lobe cortical restricted diffusion on series 11, image 19. No posterior fossa restricted diffusion. Major intracranial vascular flow voids are preserved.  Patchy and confluent bilateral cerebral white matter T2 and FLAIR hyperintensity in addition to the acute finding above, in areas most resembles chronic lacunar infarcts (right atrium). There are occasional chronic micro hemorrhages in the brain (left parieto-occipital sulcus). No cortical encephalomalacia identified. Several chronic lacunar infarcts in the deep gray matter nuclei including the left thalamus. Brainstem and cerebellum within normal limits.  No midline shift, mass effect, evidence of mass lesion, ventriculomegaly, extra-axial collection or acute intracranial hemorrhage. Cervicomedullary junction and pituitary are within normal limits. Negative visualized cervical spine. Normal bone marrow signal Visible internal auditory structures appear normal. Visualized orbit soft tissues are within normal limits. Visualized paranasal sinuses and mastoids are clear. Visualized scalp soft tissues are within normal limits.  MRA HEAD FINDINGS  Antegrade flow in the posterior circulation with codominant distal vertebral arteries. Normal vertebrobasilar junction. Basilar artery irregularity, but no hemodynamically significant basilar artery stenosis. SCA and right PCA origins are within normal limits. Fetal type left PCA origin. Small right posterior communicating artery. Bilateral PCA branches are within normal limits.  Antegrade flow in both ICA siphons. Tortuous distal cervical ICA is greater on the right. No ICA siphon  stenosis. Ophthalmic and posterior communicating artery origins are within normal limits. Patent carotid termini. Normal ACA and left MCA origins. Anterior communicating artery diminutive or absent. Visualized bilateral ACA and left MCA branches are within normal limits.  Just be on the right MCA origin there is a short 2 mm segment of irregularity and high-grade stenosis (series 605, image 7). The M1 segment remains patent. The right MCA bifurcation is patent. No major right MCA branch occlusion is identified.  IMPRESSION: MRI BRAIN:  1. Small acute infarct in the right MCA pre motor area affecting white matter. No mass effect or hemorrhage. 2. Questionable small acute cortically based infarct in the right occipital lobe (right PCA territory). 3. Underlying chronic small vessel disease.  MRA BRAIN:  1. Two mm high-grade stenosis of the right MCA proximal M1 segment compatible with thromboembolic disease in this setting, and congruent with the acute findings in #1 above. 2. No major right MCA branch occlusion identified. Otherwise negative anterior circulation. 3. 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 Otherwise negative posterior circulation.   Electronically Signed   By: Augusto Gamble M.D.   On: 06/19/2014 13:45   Mr Brain Wo Contrast  06/19/2014   CLINICAL DATA:  63 year old male with left side weakness and numbness plus slurred  speech. Symptoms resolved after 15 min. Initial encounter.  EXAM: MRI HEAD WITHOUT CONTRAST  MRA HEAD WITHOUT CONTRAST  TECHNIQUE: Multiplanar, multiecho pulse sequences of the brain and surrounding structures were obtained without intravenous contrast. Angiographic images of the head were obtained using MRA technique without contrast.  COMPARISON:  Head CT without contrast 06/18/2014.  FINDINGS: MRI HEAD FINDINGS  Wallace Cullens and white matter signal is within normal limits for age throughout the brain. 2 cm area of confluent restricted diffusion in the right  superior frontal gyrus white matter (subcortical and more central) corresponding to the pre motor region (series 8, image 19, series 5, image 15). No associated mass effect or hemorrhage.  No contralateral restricted diffusion. Questionable small focus of right occipital lobe cortical restricted diffusion on series 11, image 19. No posterior fossa restricted diffusion. Major intracranial vascular flow voids are preserved.  Patchy and confluent bilateral cerebral white matter T2 and FLAIR hyperintensity in addition to the acute finding above, in areas most resembles chronic lacunar infarcts (right atrium). There are occasional chronic micro hemorrhages in the brain (left parieto-occipital sulcus). No cortical encephalomalacia identified. Several chronic lacunar infarcts in the deep gray matter nuclei including the left thalamus. Brainstem and cerebellum within normal limits.  No midline shift, mass effect, evidence of mass lesion, ventriculomegaly, extra-axial collection or acute intracranial hemorrhage. Cervicomedullary junction and pituitary are within normal limits. Negative visualized cervical spine. Normal bone marrow signal Visible internal auditory structures appear normal. Visualized orbit soft tissues are within normal limits. Visualized paranasal sinuses and mastoids are clear. Visualized scalp soft tissues are within normal limits.  MRA HEAD FINDINGS  Antegrade flow in the posterior circulation with codominant distal vertebral arteries. Normal vertebrobasilar junction. Basilar artery irregularity, but no hemodynamically significant basilar artery stenosis. SCA and right PCA origins are within normal limits. Fetal type left PCA origin. Small right posterior communicating artery. Bilateral PCA branches are within normal limits.  Antegrade flow in both ICA siphons. Tortuous distal cervical ICA is greater on the right. No ICA siphon stenosis. Ophthalmic and posterior communicating artery origins are within  normal limits. Patent carotid termini. Normal ACA and left MCA origins. Anterior communicating artery diminutive or absent. Visualized bilateral ACA and left MCA branches are within normal limits.  Just be on the right MCA origin there is a short 2 mm segment of irregularity and high-grade stenosis (series 605, image 7). The M1 segment remains patent. The right MCA bifurcation is patent. No major right MCA branch occlusion is identified.  IMPRESSION: MRI BRAIN:  1. Small acute infarct in the right MCA pre motor area affecting white matter. No mass effect or hemorrhage. 2. Questionable small acute cortically based infarct in the right occipital lobe (right PCA territory). 3. Underlying chronic small vessel disease.  MRA BRAIN:  1. Two mm high-grade stenosis of the right MCA proximal M1 segment compatible with thromboembolic disease in this setting, and congruent with the acute findings in #1 above. 2. No major right MCA branch occlusion identified. Otherwise negative anterior circulation. 3. 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 Otherwise negative posterior circulation.   Electronically Signed   By: Augusto Gamble M.D.   On: 06/19/2014 13:45   Medications: I have reviewed the patient's current medications.  Prescriptions prior to admission  Medication Sig Dispense Refill  . albuterol (PROVENTIL HFA;VENTOLIN HFA) 108 (90 BASE) MCG/ACT inhaler Inhale 1-2 puffs into the lungs every 6 (six) hours as needed for wheezing or shortness of breath.      Marland Kitchen  aspirin EC 81 MG tablet Take 81 mg by mouth daily.      . insulin glargine (LANTUS) 100 UNIT/ML injection Inject 30 Units into the skin at bedtime.      . insulin regular (NOVOLIN R,HUMULIN R) 100 units/mL injection Inject 45 Units into the skin 3 (three) times daily before meals.      Marland Kitchen. lisinopril (PRINIVIL,ZESTRIL) 20 MG tablet Take 20 mg by mouth daily.      Marland Kitchen. omeprazole (PRILOSEC) 20 MG capsule Take 20 mg by mouth daily.        . traMADol (ULTRAM) 50 MG tablet Take 50 mg by mouth every 6 (six) hours as needed for moderate pain.        Scheduled Meds: . atorvastatin  40 mg Oral q1800  . insulin aspart  0-9 Units Subcutaneous TID WC  . insulin glargine  10 Units Subcutaneous QHS  . pantoprazole  40 mg Oral Daily  . research study medication  100 mg Oral Daily  . research study medication  90 mg Oral BID   Continuous Infusions:  PRN Meds:.traMADol Assessment/Plan:  Mr. Richardson DoppCole is a 63 yo male with PMHx of HTN, GERD, Asthma and DM type II who presented to the ED with complaint of left sided weakness and numbness and slurred speech which lasted for 15 minutes and resolved.   Acute ischemic stroke: Symptoms have remained unchanged. He has good strength on neurologic exam. Head CT scan was unremarkable. Brain MRI revealed right MCA infarct. Brain MRA revealed high-grade stenosis of the right MCA raising the concern of thromboembolic disease. Echocardiogram marked for grade 1 diastolic dysfunction, otherwise, normal. Carotid Dopplers unremarkable. Per neurology patient will require TEE and loop recorder to look for source of emboli. Cardiology has been consulted. If the TEE is unrevealing, patient requires a loop recorder. Patient ate breakfast this morning so TEE will be delayed.  -TEE will be done this afternoon. -Patient is on Socrates Study for antiplatelet therapy -Telemetry monitoring  -Neurology following -NPO until after procedure -Awaiting Cardiology recommendations  TWI in inferolateral leads: EKG shows nonspecific TWI in inferolateral leads. No priors for comparison. Patient is asymptomatic. Pt had an exercise stress test with nuclear imaging in March 2015. Per patient, test was normal. Troponins negative x 3 . Telemetry monitoring continues to show irregular rhythm with PVCs. Regular rate. Cardiology has been consulted for TEE and loop recorder. -Telemetry monitoring  -Awaiting Cardiology  recommendations  HTN: BP 143/73 on admission. Not on any blood pressure medications at home.  -Stable -Will restart Losartan 25 mg daily after 48 hours since his symptoms of stroke.   HLD: Lipid panel showed total cholesterol 239, TG 123, HDL 36, LDL 178.  -Atorvastatin 40 mg daily   DM Type II: Pt takes lantus 30 units at night and regular insulin 45 U TID. Hemoglobin A1C 6.7. Glucose has remained stable during admission. 100s-140s. -Lantus 10 U QHS -SSI-Sensitive TID -CBG 4 times daily  GERD: Pt takes omeprazole 20 mg daily at home. Denies any nausea or reflux symptoms.  -Continue omeprazole 20 mg daily   DVT/PE ppx: SCDs  Dispo: Disposition is deferred at this time, awaiting improvement of current medical problems. Patient will need to keep him in-house for completion of a stroke workup, including TEE and loop recorder placement.  The patient does have a current PCP Charolett Bumpers(George K Kroner, PA-C) and [facility] after [facility].  The patient does have transportation limitations that hinder transportation to  clinic appointments.  .Services Needed at time of discharge: Y = Yes, Blank = No PT:   OT:   RN:   Equipment:   Other:     LOS: 3 days   Signed:  Jill Alexanders, DO PGY-1 Internal Medicine Resident Pager # [phone] 06/21/2014 12:26 PM

## 2014-06-21 NOTE — Interval H&P Note (Signed)
History and Physical Interval Note:  06/21/2014 3:03 PM  Stephen Mccullough  has presented today for surgery, with the diagnosis of stroke  The various methods of treatment have been discussed with the patient and family. After consideration of risks, benefits and other options for treatment, the patient has consented to  Procedure(s): TRANSESOPHAGEAL ECHOCARDIOGRAM (TEE) (N/A) as a surgical intervention .  The patient's history has been reviewed, patient examined, no change in status, stable for surgery.  I have reviewed the patient's chart and labs.  Questions were answered to the patient's satisfaction.     Lars MassonNELSON, Taraya Steward H

## 2014-06-21 NOTE — CV Procedure (Addendum)
     Transesophageal Echocardiogram Note  Burna CashLonnie Biggs [id number] 1951-03-28  Procedure: Transesophageal Echocardiogram Indications: CVA  Procedure Details Consent: Obtained Time Out: Verified patient identification, verified procedure, site/side was marked, verified correct patient position, special equipment/implants available, Radiology Safety Procedures followed,  medications/allergies/relevent history reviewed, required imaging and test results available.  Performed  Medications: Fentanyl: 75 mcg  Versed: 4 mg  Left Ventrical:  The cavity size was normal. Wall thickness was increased in a pattern of mild LVH. Systolic function was normal. The estimated ejection fraction was in the range of 55% to 60%. Wall motion was normal; there were no regional wall motion abnormalities.   Mitral Valve: Structuraly normal, mild MR.  Aortic Valve: Trileaflet, trace AI.  Tricuspid Valve: Structuraly normal, no TR  Pulmonic Valve: Grossly normal, no PR  Left Atrium/ Left atrial appendage: No thrombus in LA or LAA, normal velocities.  Atrial septum: No PFO by color Doppler or agitated saline with and without maneuvers.  Right ventricle: Normal function.  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   Complications: No complications. Patient did tolerate procedure well.  Lars MassonNELSON, Shenay Torti H, MD, [facility] 06/21/2014, 3:03 PM

## 2014-06-21 NOTE — H&P (View-Only) (Signed)
Stroke Team Progress Note  HISTORY Stephen Mccullough is a 63 y.o. male who had noted over a few days prior to admission he had intermittent numbness and tingling in his left forearm. He stated he had two episodes a day on average. On 06/18/2014 he had just finished mowing a lawn when he stopped at his house. He went to push the clutch with his left foot and noted he had no strength and could not feel the pedal. At the same time he noted his left hand was week and had no strength. He called his brother who came over and helped him out of the car. Within 15 minutes the symptoms resolved.  He takes ASA daily    Date last known well: Date: 06/18/2014  Time last known well: Time: 11:00  tPA Given: No: symptoms fully resolved.     SUBJECTIVE No family present today. The patient is without complaints. He feels his strength has returned to normal.TEE not done as patient ate and is scheduled for tomorrow OBJECTIVE Most recent Vital Signs: Filed Vitals:   06/20/14 2055 06/21/14 0204 06/21/14 0640 06/21/14 1001  BP: 153/85 145/85 147/77 151/71  Pulse: 71 65 66 64  Temp: 98 F (36.7 C) 98.2 F (36.8 C) 97.7 F (36.5 C) 97.9 F (36.6 C)  TempSrc: Oral Oral Oral Oral  Resp: 20 18 18 18   Height:      Weight:      SpO2: 99% 100% 98% 98%   CBG (last 3)   Recent Labs  06/20/14 2057 06/21/14 0655 06/21/14 1136  GLUCAP 167* 118* 126*    IV Fluid Intake:     MEDICATIONS  . atorvastatin  40 mg Oral q1800  . insulin aspart  0-9 Units Subcutaneous TID WC  . insulin glargine  10 Units Subcutaneous QHS  . pantoprazole  40 mg Oral Daily  . research study medication  100 mg Oral Daily  . research study medication  90 mg Oral BID   PRN:  traMADol  Diet:    thin liquids Activity:  Bathroom privileges with assistance DVT Prophylaxis:  SCDs  CLINICALLY SIGNIFICANT STUDIES Basic Metabolic Panel:   Recent Labs Lab 06/18/14 1226 06/21/14 0500  NA 144 141  K 3.9 4.2  CL 106 104  CO2 25 25   GLUCOSE 96 117*  BUN 17 18  CREATININE 1.15 1.23  CALCIUM 9.2 9.1   Liver Function Tests:   Recent Labs Lab 06/18/14 1226  AST 26  ALT 21  ALKPHOS 109  BILITOT 0.4  PROT 7.9  ALBUMIN 4.3   CBC:   Recent Labs Lab 06/18/14 1226 06/21/14 0500  WBC 9.8 10.4  NEUTROABS 5.7  --   HGB 14.5 14.4  HCT 43.5 42.6  MCV 85.1 83.9  PLT 239 213   Coagulation:   Recent Labs Lab 06/18/14 1226  LABPROT 13.4  INR 1.02   Cardiac Enzymes:   Recent Labs Lab 06/18/14 1822 06/19/14 0020 06/19/14 0639  TROPONINI <0.30 <0.30 <0.30   Urinalysis: No results found for this basename: COLORURINE, APPERANCEUR, LABSPEC, PHURINE, GLUCOSEU, HGBUR, BILIRUBINUR, KETONESUR, PROTEINUR, UROBILINOGEN, NITRITE, LEUKOCYTESUR,  in the last 168 hours Lipid Panel    Component Value Date/Time   CHOL 239* 06/18/2014 1409   TRIG 123 06/18/2014 1409   HDL 36* 06/18/2014 1409   CHOLHDL 6.6 06/18/2014 1409   VLDL 25 06/18/2014 1409   LDLCALC 178* 06/18/2014 1409   HgbA1C  Lab Results  Component Value Date   HGBA1C 6.7* 06/18/2014  Urine Drug Screen:     Component Value Date/Time   LABOPIA NONE DETECTED 06/18/2014 2157   COCAINSCRNUR NONE DETECTED 06/18/2014 2157   LABBENZ NONE DETECTED 06/18/2014 2157   AMPHETMU NONE DETECTED 06/18/2014 2157   THCU NONE DETECTED 06/18/2014 2157   LABBARB NONE DETECTED 06/18/2014 2157    Alcohol Level: No results found for this basename: ETH,  in the last 168 hours  Ct Head (brain) Wo Contrast 06/18/2014    Chronic small vessel disease throughout the deep white matter. No acute intracranial abnormality.       MRI of the brain  06/19/2014 1. Small acute infarct in the right MCA pre motor area affecting white matter. No mass effect or hemorrhage.  2. Questionable small acute cortically based infarct in the right occipital lobe (right PCA territory).  3. Underlying chronic small vessel disease.   MRA of the brain  06/19/2014 1. Two mm high-grade stenosis of the right MCA  proximal M1 segment compatible with thromboembolic disease in this setting, and congruent with the acute findings in #1 above.  2. No major right MCA branch occlusion identified. Otherwise negative anterior circulation.  3. 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 Otherwise negative posterior circulation.    Carotid Doppler - Preliminary report: 1-39% ICA stenosis. Vertebral artery flow is antegrade.    2D Echocardiogram - ejection fraction 55-60%. No cardiac source of emboli identified.  CXR    EKG - SR rate 75 BPM - For complete results please see formal report.   Therapy Recommendations - no further physical or occupational therapy recommended.   Filed Vitals:   06/21/14 1001  BP: 151/71  Pulse: 64  Temp: 97.9 F (36.6 C)  Resp: 18    Physical Exam   Mental Status:  Alert, oriented, thought content appropriate. Speech fluent without evidence of aphasia. Able to follow 3 step commands without difficulty.  Cranial Nerves:  II: Discs not seen; Visual fields grossly normal, pupils equal, round, reactive to light and accommodation  III,IV, VI: ptosis not present, extra-ocular motions intact bilaterally  V,VII: smile symmetric, facial light touch sensation normal bilaterally  VIII: hearing normal bilaterally  IX,X: gag reflex present  XI: bilateral shoulder shrug  XII: midline tongue extension without atrophy or fasciculations  Motor:  Right : Upper extremity 5/5 Left: Upper extremity 5/5  Lower extremity 5/5 Lower extremity 5/5  Tone and bulk:normal tone throughout; no atrophy noted  Sensory: Pinprick and light touch intact throughout, bilaterally  Deep Tendon Reflexes:  Right: Upper Extremity Left: Upper extremity  biceps (C-5 to C-6) 2/4 biceps (C-5 to C-6) 2/4  tricep (C7) 2/4 triceps (C7) 2/4  Brachioradialis (C6) 2/4 Brachioradialis (C6) 2/4  Lower Extremity Lower Extremity  quadriceps (L-2 to L-4) 1/4 quadriceps (L-2 to L-4) 1/4   Achilles (S1) 0/4 Achilles (S1) 0/4  Plantars:  Right: downgoing Left: downgoing  Cerebellar:  normal finger-to-nose, normal heel-to-shin test  Gait: not tested   ASSESSMENT Mr. Stephen Mccullough is a 63 y.o. male presenting with transient left hemiparesis. No TPA - symptoms resolved. Head CT negative. MRI - small acute infarct in the right MCA pre motor area affecting white matter. Questionable small acute cortically based infarct in the right occipital lobe (right PCA territory). 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 Embolic source unknown. On aspirin 81 mg orally every day prior to admission. Now on study drug for secondary stroke prevention. Patient with resultant resolution of deficits. Stroke work up underway.   Participating in [employer] [facility]  Hyperlipidemia - Chol - 239 ; LDL - 178 - Now on Lipitor (no statin PTA)  Htn  DM - HbgA1C 6.7 (goal < 7)  MRA - Two mm high-grade stenosis of the right MCA proximal M1 segment compatible with thromboembolic disease   Hospital day # 3  TREATMENT/PLAN  Continue study drug for secondary stroke prevention.  No followup therapy recommended.  Recommend TEE and loop placement tomorrow to look for source of emboli.  Discussed with Dr. Ardine Eng, MD        To contact Stroke Continuity provider, please refer to [email or web address]. After hours, contact General Neurology

## 2014-06-22 ENCOUNTER — Encounter (HOSPITAL_COMMUNITY): Payer: Self-pay | Admitting: Cardiology

## 2014-06-22 ENCOUNTER — Encounter (HOSPITAL_COMMUNITY)
Admission: EM | Disposition: A | Discharge status: Home / Self Care | Payer: Self-pay | Source: Home / Self Care | Attending: Internal Medicine

## 2014-06-22 DIAGNOSIS — I6350 Cerebral infarction due to unspecified occlusion or stenosis of unspecified cerebral artery: Secondary | ICD-10-CM

## 2014-06-22 HISTORY — PX: LOOP RECORDER IMPLANT: SHX5477

## 2014-06-22 LAB — GLUCOSE, CAPILLARY
GLUCOSE-CAPILLARY: 163 mg/dL — AB (ref 70–99)
Glucose-Capillary: 138 mg/dL — ABNORMAL HIGH (ref 70–99)
Glucose-Capillary: 200 mg/dL — ABNORMAL HIGH (ref 70–99)
Glucose-Capillary: 183 mg/dL — ABNORMAL HIGH (ref 70–99)

## 2014-06-22 SURGERY — LOOP RECORDER IMPLANT
Anesthesia: LOCAL

## 2014-06-22 MED ORDER — INSULIN REGULAR HUMAN 100 UNIT/ML IJ SOLN: 3.0000 [IU] | Freq: Three times a day (TID) | INTRAMUSCULAR | Status: AC

## 2014-06-22 MED ORDER — LIDOCAINE-EPINEPHRINE 1 %-1:100000 IJ SOLN
INTRAMUSCULAR | Status: AC
  Filled 2014-06-22: qty 1

## 2014-06-22 MED ORDER — INSULIN GLARGINE 100 UNIT/ML ~~LOC~~ SOLN: 15.0000 [IU] | Freq: Every day | SUBCUTANEOUS | Status: AC

## 2014-06-22 MED ORDER — ATORVASTATIN CALCIUM 40 MG PO TABS: 40.0000 mg | ORAL_TABLET | Freq: Every day | ORAL | Status: AC

## 2014-06-22 NOTE — Progress Notes (Addendum)
Subjective:  Patient was seen and examined this morning. Patient denies any complaints. Denies chest pain, shortness of breath, nausea, vomiting, weakness or dizziness.  Objective: Vital signs in last 24 hours: Filed Vitals:   06/22/14 0149 06/22/14 0547 06/22/14 0959 06/22/14 1352  BP: 133/89 142/85 139/97 149/81  Pulse: 62  88 85  Temp: 97.9 F (36.6 C) 97.7 F (36.5 C) 98.3 F (36.8 C) 98 F (36.7 C)  TempSrc: Oral Oral Oral Oral  Resp: 18 20 20 20   Height:      Weight:      SpO2: 98% 99% 98% 99%    General: Vital signs reviewed.  Patient is well-developed and well-nourished, in no acute distress and cooperative with exam.  Neck: No carotid bruit present.  Cardiovascular: Irregular rhythm, regular rate, S1 normal, S2 normal, no murmurs, gallops, or rubs. Pulmonary/Chest: Clear to auscultation bilaterally, no wheezes, rales, or rhonchi. Abdominal: Soft, non-tender, non-distended, BS +, no masses, organomegaly, or guarding present.  Musculoskeletal: No joint deformities, erythema, or stiffness, ROM full and nontender. Extremities: No lower extremity edema bilaterally,  pulses symmetric and intact bilaterally. No cyanosis or clubbing. Neurological: AA&O x3, Strength is normal and symmetric bilaterally 5/5, cranial nerve II-XII are grossly intact, no focal motor deficit, sensory intact to light touch bilaterally.  Skin: Warm, dry and intact. No rashes or erythema. Psychiatric: Normal mood and affect. speech and behavior is normal. Cognition and memory are normal.   Lab Results:  Basic Metabolic Panel:  Recent Labs Lab 06/18/14 1226 06/21/14 0500  NA 144 141  K 3.9 4.2  CL 106 104  CO2 25 25  GLUCOSE 96 117*  BUN 17 18  CREATININE 1.15 1.23  CALCIUM 9.2 9.1   Liver Function Tests:  Recent Labs Lab 06/18/14 1226  AST 26  ALT 21  ALKPHOS 109  BILITOT 0.4  PROT 7.9  ALBUMIN 4.3   CBC:  Recent Labs Lab 06/18/14 1226 06/21/14 0500  WBC 9.8 10.4    NEUTROABS 5.7  --   HGB 14.5 14.4  HCT 43.5 42.6  MCV 85.1 83.9  PLT 239 213   Cardiac Enzymes:  Recent Labs Lab 06/18/14 1822 06/19/14 0020 06/19/14 0639  TROPONINI <0.30 <0.30 <0.30   CBG:  Recent Labs Lab 06/21/14 1136 06/21/14 1634 06/21/14 2136 06/22/14 0712 06/22/14 1152 06/22/14 1407  GLUCAP 126* 120* 158* 138* 200* 163*   Hemoglobin A1C:  Recent Labs Lab 06/18/14 1226  HGBA1C 6.7*   Fasting Lipid Panel:  Recent Labs Lab 06/18/14 1409  CHOL 239*  HDL 36*  LDLCALC 178*  TRIG 123  CHOLHDL 6.6   Coagulation:  Recent Labs Lab 06/18/14 1226  LABPROT 13.4  INR 1.02   Urine Drug Screen: Drugs of Abuse     Component Value Date/Time   LABOPIA NONE DETECTED 06/18/2014 2157   COCAINSCRNUR NONE DETECTED 06/18/2014 2157   LABBENZ NONE DETECTED 06/18/2014 2157   AMPHETMU NONE DETECTED 06/18/2014 2157   THCU NONE DETECTED 06/18/2014 2157   LABBARB NONE DETECTED 06/18/2014 2157    Studies/Results: No results found. Medications: I have reviewed the patient's current medications.  Prescriptions prior to admission  Medication Sig Dispense Refill  . albuterol (PROVENTIL HFA;VENTOLIN HFA) 108 (90 BASE) MCG/ACT inhaler Inhale 1-2 puffs into the lungs every 6 (six) hours as needed for wheezing or shortness of breath.      Marland Kitchen aspirin EC 81 MG tablet Take 81 mg by mouth daily.      Marland Kitchen lisinopril (PRINIVIL,ZESTRIL)  20 MG tablet Take 20 mg by mouth daily.      Marland Kitchen. omeprazole (PRILOSEC) 20 MG capsule Take 20 mg by mouth daily.      . traMADol (ULTRAM) 50 MG tablet Take 50 mg by mouth every 6 (six) hours as needed for moderate pain.      . [DISCONTINUED] insulin regular (NOVOLIN R,HUMULIN R) 100 units/mL injection Inject 15 Units into the skin 3 (three) times daily before meals.         Scheduled Meds: . atorvastatin  40 mg Oral q1800  . insulin aspart  0-9 Units Subcutaneous TID WC  . insulin glargine  10 Units Subcutaneous QHS  . pantoprazole  40 mg Oral Daily  .  research study medication  100 mg Oral Daily  . research study medication  90 mg Oral BID   Continuous Infusions:  PRN Meds:.traMADol Assessment/Plan:  Mr. Richardson DoppCole is a 63 yo male with PMHx of HTN, GERD, Asthma and DM type II who presented to the ED with complaint of left sided weakness and numbness and slurred speech which lasted for 15 minutes and resolved.   Acute ischemic stroke: Symptoms have remained unchanged. He has good strength on neurologic exam. Head CT scan was unremarkable. Brain MRI revealed right MCA infarct. Brain MRA revealed high-grade stenosis of the right MCA raising the concern of thromboembolic disease. Echocardiogram marked for grade 1 diastolic dysfunction, otherwise, normal. Carotid Dopplers unremarkable. TEE showed no cardiac source of emboli was indentified. Patient had loop recorder placed today. -Patient is on [employer] Study for antiplatelet therapy -Telemetry monitoring  -Will follow up with Dr. Roda ShuttersXu, Neurologist 08/03/2014 at 11 am   TWI in inferolateral leads: EKG shows nonspecific TWI in inferolateral leads. No priors for comparison. Patient is asymptomatic. Pt had an exercise stress test with nuclear imaging in March 2015. Per patient, test was normal. Troponins negative x 3 . Telemetry monitoring continues to show irregular rhythm with PVCs. Regular rate. Cardiology has been consulted for loop recorder. -Telemetry monitoring   HTN: BP 143/73 on admission. Not on any blood pressure medications at home.  -Stable -Will restart Losartan 25 mg daily after 48 hours since his symptoms of stroke.   HLD: Lipid panel showed total cholesterol 239, TG 123, HDL 36, LDL 178.  -Atorvastatin 40 mg daily   DM Type II: Pt takes lantus 30 units at night and regular insulin 15 U TID. Hemoglobin A1C 6.7. Glucose has remained stable during admission. 100s-140s. -Lantus 10 U QHS -SSI-Sensitive TID -CBG 4 times daily  GERD: Pt takes omeprazole 20 mg daily at home. Denies any nausea  or reflux symptoms.  -Continue omeprazole 20 mg daily   DVT/PE ppx: SCDs  Dispo: Will discharge to home.  The patient does have a current PCP Charolett Bumpers(George K Kroner, PA-C) and [facility].  The patient does have transportation limitations that hinder transportation to clinic appointments.  .Services Needed at time of discharge: Y = Yes, Blank = No PT:   OT:   RN:   Equipment:   Other:     LOS: 4 days   Signed:  Jill AlexandersAlexa Richardson, DO PGY-1 Internal Medicine Resident Pager # [phone] 06/22/2014 2:46 PM

## 2014-06-22 NOTE — Discharge Instructions (Signed)
Thank you for allowing [place] to be involved in your healthcare while you were hospitalized at [facility].   Please note that there have been changes to your home medications.  --> PLEASE LOOK AT YOUR DISCHARGE MEDICATION LIST FOR DETAILS  Please call your PCP if you have any questions or concerns, or any difficulty getting any of your medications.  Please return to the ER if you have worsening of your symptoms or new severe symptoms arise.   Ischemic Stroke Blood carries oxygen to all areas of your body. A stroke happens when your blood does not flow to your brain like normal. If this happens, your brain will not get the oxygen it needs and brain tissue will die. This is an emergency. Problems (symptoms) of a stroke usually happen suddenly. You may notice them when you wake up. They can include:  Loss of feeling or weakness on one side of the body (face, arm, leg).  Feeling confused.  Trouble talking or understanding.  Trouble seeing.  Trouble walking.  Feeling dizzy.  Loss of balance or coordination.  Severe headache without a cause.  Trouble reading or writing. Get help as soon as any of these problems first start. This is important.  RISK FACTORS  Risk factors are things that make it more likely for you to have a stroke. These things include:  High blood pressure (hypertension).  High cholesterol.  Diabetes.  Heart disease.  Having a buildup of fatty deposits in the blood vessels.  Having an abnormal heart rhythm (atrial fibrillation).  Being very overweight (obese).  Smoking.  Taking birth control pills, especially if you smoke.  Not being active.  Having a diet high in fats, salt, and calories.  Drinking too much alcohol.  Using illegal drugs.  Being African American.  Being over the age of 46.  Having a family history of stroke.  Having a history of blood clots, stroke, warning stroke (transient ischemic attack, TIA), or heart  attack.  Sickle cell disease. HOME CARE  Take all medicines exactly as told by your doctor. Understand all your medicine instructions.  You may need to take a medicine to thin your blood, like aspirin or warfarin. Take warfarin exactly as told.  Taking too much or too little warfarin is dangerous. Get regular blood tests as told, including the PT and INR tests. The test results help your doctor adjust your dose of warfarin. Your PT and INR levels must be done as often as told by your doctor.  Food can cause problems with warfarin and affect the results of your blood tests. This is true for foods high in vitamin K, such as spinach, kale, broccoli, cabbage, collard and turnip greens, Brussels sprouts, peas, cauliflower, seaweed, and parsley, as well as beef and pork liver, green tea, and soybean oil. Eat the same amount of food high in vitamin K. Avoid major changes in your diet. Tell your doctor before changing your diet. Talk to a food specialist (dietitian) if you have questions.  Many medicines can cause problems with warfarin and affect your PT and INR test results. Tell your doctor about all medicines you take. This includes vitamins and dietary pills (supplements). Be careful with aspirin and medicines that relieve redness, soreness, and puffiness (inflammation). Do not take or stop medicines unless your doctor tells you to.  Warfarin can cause a lot of bruising or bleeding. Hold pressure over cuts for longer than normal. Talk to your doctor about other side effects  of warfarin.  Avoid sports or activities that may cause injury or bleeding.  Be careful when you shave, floss your teeth, or use sharp objects.  Avoid alcoholic drinks or drink very little alcohol while taking warfarin. Tell your doctor if you change how much alcohol you drink.  Tell your dentist and other doctors that you take warfarin before procedures.  If you are able to swallow, eat healthy foods. Eat 5 or more servings  of fruits and vegetables a day. Eat soft foods, pureed foods, or eat small bites of food so you do not choke.  Follow your diet program as told, if you are given one.  Keep a healthy weight.  Stay active. Try to get at least 30 minutes of activity on most or all days.  Do not smoke.  Limit how much alcohol you drink even if you are not taking warfarin. Moderate alcohol use is:  No more than 2 drinks each day for men.  No more than 1 drink each day for women who are not pregnant.  Keep your home safe so you do not fall. Try:  Putting grab bars in the bedroom and bathroom.  Raising toilet seats.  Putting a seat in the shower.  Go to therapy sessions (physical, occupational, and speech) as told by your doctor.  Use a walker or cane at all times if told to do so.  Keep all doctor visits as told. GET HELP IF:  Your personality changes.  You have trouble swallowing.  You are seeing two of everything.  You are dizzy.  You have a fever.  Your skin starts to break down. GET HELP RIGHT AWAY IF:  The symptoms below may be a sign of an emergency. Do not wait to see if the symptoms go away. Call for help (911 in [place] Do not drive yourself to the hospital.  You have sudden weakness or numbness on the face, arm, or leg (especially on one side of the body).  You have sudden trouble walking or moving your arms or legs.  You have sudden confusion.  You have trouble talking or understanding.  You have sudden trouble seeing in one or both eyes.  You lose your balance or your movements are not smooth.  You have a sudden, severe headache with no known cause.  You have new chest pain or you feel your heart beating in an unsteady way.  You are partly or totally unaware of what is going on around you. Document Released: 10/18/2011 Document Revised: 03/15/2014 Document Reviewed: 06/08/2012 [facility] Patient Information 2015 [employer] [place]. This information is not intended to  replace advice given to you by your health care provider. Make sure you discuss any questions you have with your health care provider.

## 2014-06-22 NOTE — Progress Notes (Signed)
Internal Medicine Attending  Date: 06/22/2014  Patient name: Stephen Mccullough Medical record number: [id number] Date of birth: 04-22-1951 Age: 63 y.o. Gender: male  I saw and evaluated the patient, and discussed his care with resident on A.M rounds.  I reviewed the resident's note by Dr. Senaida Oresichardson and I agree with the resident's findings and plans as documented in her note.

## 2014-06-22 NOTE — Consult Note (Signed)
ELECTROPHYSIOLOGY CONSULT NOTE  Patient ID: Stephen Mccullough MRN: [id number], DOB/AGE: 1951-03-31   Admit date: 06/18/2014 Date of Consult: 06/22/2014  Primary Physician: Charolett Bumpers, PA-C Primary Cardiologist: new to [facility] Reason for Consultation: Cryptogenic stroke; recommendations regarding Implantable Loop Recorder  History of Present Illness Stephen Mccullough was admitted on 06/18/2014 with left sided weakness.  Imaging demonstrated small acute infarct in right MCA pre motor area and questionable small acute cortically based infarct in right occipital lobe (PCA territory).  He has undergone workup for stroke including echocardiogram and carotid dopplers.  The patient has been monitored on telemetry which has demonstrated sinus rhythm with no arrhythmias.  TEE yesterday demonstrated no cause for stroke.   Echocardiogram this admission demonstrated EF 55-60%, no RWMA, grade 1 diastolic dysfunction, LA 43.  Lab work is reviewed.   Prior to admission, the patient denies chest pain, shortness of breath, dizziness, palpitations, or syncope.  They are recovering from their stroke with plans to return home at discharge.  EP has been asked to evaluate for placement of an implantable loop recorder to monitor for atrial fibrillation.  ROS is negative except as outlined above.    Past Medical History  Diagnosis Date  . Hypertension   . Diabetes mellitus without complication   . Stroke     2015     Surgical History:  Past Surgical History  Procedure Laterality Date  . Appendectomy    . Tee without cardioversion N/A 06/21/2014    Procedure: TRANSESOPHAGEAL ECHOCARDIOGRAM (TEE);  Surgeon: Lars Masson, MD;  Location: [facility] ENDOSCOPY;  Service: Cardiovascular;  Laterality: N/A;     Prescriptions prior to admission  Medication Sig Dispense Refill  . albuterol (PROVENTIL HFA;VENTOLIN HFA) 108 (90 BASE) MCG/ACT inhaler Inhale 1-2 puffs into the lungs every 6 (six) hours as needed for  wheezing or shortness of breath.      Marland Kitchen aspirin EC 81 MG tablet Take 81 mg by mouth daily.      . insulin glargine (LANTUS) 100 UNIT/ML injection Inject 30 Units into the skin at bedtime.      . insulin regular (NOVOLIN R,HUMULIN R) 100 units/mL injection Inject 45 Units into the skin 3 (three) times daily before meals.      Marland Kitchen lisinopril (PRINIVIL,ZESTRIL) 20 MG tablet Take 20 mg by mouth daily.      Marland Kitchen omeprazole (PRILOSEC) 20 MG capsule Take 20 mg by mouth daily.      . traMADol (ULTRAM) 50 MG tablet Take 50 mg by mouth every 6 (six) hours as needed for moderate pain.        Inpatient Medications:  . atorvastatin  40 mg Oral q1800  . insulin aspart  0-9 Units Subcutaneous TID WC  . insulin glargine  10 Units Subcutaneous QHS  . pantoprazole  40 mg Oral Daily  . research study medication  100 mg Oral Daily  . research study medication  90 mg Oral BID    Allergies: No Known Allergies  History   Social History  . Marital Status: Single    Spouse Name: N/A    Number of Children: N/A  . Years of Education: N/A   Occupational History  . Not on file.   Social History Main Topics  . Smoking status: Never Smoker   . Smokeless tobacco: Not on file  . Alcohol Use: No  . Drug Use: No  . Sexual Activity: Not on file   Other Topics Concern  . Not on file  Social History Narrative  . No narrative on file     Family History  Problem Relation Age of Onset  . Hypertension Mother   . Hypertension Father     BP 142/85  Pulse 62  Temp(Src) 97.7 F (36.5 C) (Oral)  Resp 20  Ht 5\' 6"  (1.676 m)  Wt 185 lb (83.915 kg)  BMI 29.87 kg/m2  SpO2 99%  Physical Exam: Well appearing 63 yo man, NAD HEENT: Unremarkable,[place], AT Neck:  6 JVD, no thyromegally Back:  No CVA tenderness Lungs:  Clear with no wheezes, rales, or rhonchi HEART:  Regular rate rhythm, no murmurs, no rubs, no clicks Abd:  soft, positive bowel sounds, no organomegally, no rebound, no guarding Ext:  2 plus  pulses, no edema, no cyanosis, no clubbing Skin:  No rashes no nodules Neuro:  CN II through XII intact, motor grossly intact   Labs:   Lab Results  Component Value Date   WBC 10.4 06/21/2014   HGB 14.4 06/21/2014   HCT 42.6 06/21/2014   MCV 83.9 06/21/2014   PLT 213 06/21/2014    Recent Labs Lab 06/18/14 1226 06/21/14 0500  NA 144 141  K 3.9 4.2  CL 106 104  CO2 25 25  BUN 17 18  CREATININE 1.15 1.23  CALCIUM 9.2 9.1  PROT 7.9  --   BILITOT 0.4  --   ALKPHOS 109  --   ALT 21  --   AST 26  --   GLUCOSE 96 117*     Radiology/Studies: Ct Head (brain) Wo Contrast 06/18/2014   CLINICAL DATA:  Left arm and leg weakness.  EXAM: CT HEAD WITHOUT CONTRAST  TECHNIQUE: Contiguous axial images were obtained from the base of the skull through the vertex without intravenous contrast.  COMPARISON:  None.  FINDINGS: Chronic microvascular changes throughout the deep white matter. No acute intracranial abnormality. Specifically, no hemorrhage, hydrocephalus, mass lesion, acute infarction, or significant intracranial injury. No acute calvarial abnormality. Visualized paranasal sinuses and mastoids clear. Orbital soft tissues unremarkable.  IMPRESSION: Chronic small vessel disease throughout the deep white matter. No acute intracranial abnormality.   Electronically Signed   By: Charlett Nose M.D.   On: 06/18/2014 12:52   Mr Shirlee Latch Wo Contrast 06/19/2014   CLINICAL DATA:  63 year old male with left side weakness and numbness plus slurred speech. Symptoms resolved after 15 min. Initial encounter.  EXAM: MRI HEAD WITHOUT CONTRAST  MRA HEAD WITHOUT CONTRAST  TECHNIQUE: Multiplanar, multiecho pulse sequences of the brain and surrounding structures were obtained without intravenous contrast. Angiographic images of the head were obtained using MRA technique without contrast.  COMPARISON:  Head CT without contrast 06/18/2014.  FINDINGS: MRI HEAD FINDINGS  Wallace Cullens and white matter signal is within normal limits for age  throughout the brain. 2 cm area of confluent restricted diffusion in the right superior frontal gyrus white matter (subcortical and more central) corresponding to the pre motor region (series 8, image 19, series 5, image 15). No associated mass effect or hemorrhage.  No contralateral restricted diffusion. Questionable small focus of right occipital lobe cortical restricted diffusion on series 11, image 19. No posterior fossa restricted diffusion. Major intracranial vascular flow voids are preserved.  Patchy and confluent bilateral cerebral white matter T2 and FLAIR hyperintensity in addition to the acute finding above, in areas most resembles chronic lacunar infarcts (right atrium). There are occasional chronic micro hemorrhages in the brain (left parieto-occipital sulcus). No cortical encephalomalacia identified. Several chronic lacunar infarcts in  the deep gray matter nuclei including the left thalamus. Brainstem and cerebellum within normal limits.  No midline shift, mass effect, evidence of mass lesion, ventriculomegaly, extra-axial collection or acute intracranial hemorrhage. Cervicomedullary junction and pituitary are within normal limits. Negative visualized cervical spine. Normal bone marrow signal Visible internal auditory structures appear normal. Visualized orbit soft tissues are within normal limits. Visualized paranasal sinuses and mastoids are clear. Visualized scalp soft tissues are within normal limits.  MRA HEAD FINDINGS  Antegrade flow in the posterior circulation with codominant distal vertebral arteries. Normal vertebrobasilar junction. Basilar artery irregularity, but no hemodynamically significant basilar artery stenosis. SCA and right PCA origins are within normal limits. Fetal type left PCA origin. Small right posterior communicating artery. Bilateral PCA branches are within normal limits.  Antegrade flow in both ICA siphons. Tortuous distal cervical ICA is greater on the right. No ICA siphon  stenosis. Ophthalmic and posterior communicating artery origins are within normal limits. Patent carotid termini. Normal ACA and left MCA origins. Anterior communicating artery diminutive or absent. Visualized bilateral ACA and left MCA branches are within normal limits.  Just be on the right MCA origin there is a short 2 mm segment of irregularity and high-grade stenosis (series 605, image 7). The M1 segment remains patent. The right MCA bifurcation is patent. No major right MCA branch occlusion is identified.  IMPRESSION: MRI BRAIN:  1. Small acute infarct in the right MCA pre motor area affecting white matter. No mass effect or hemorrhage. 2. Questionable small acute cortically based infarct in the right occipital lobe (right PCA territory). 3. Underlying chronic small vessel disease.  MRA BRAIN:  1. Two mm high-grade stenosis of the right MCA proximal M1 segment compatible with thromboembolic disease in this setting, and congruent with the acute findings in #1 above. 2. No major right MCA branch occlusion identified. Otherwise negative anterior circulation. 3. 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 Otherwise negative posterior circulation.   Electronically Signed   By: Augusto GambleLee  Hall M.D.   On: 06/19/2014 13:45   12-lead ECG sinus rhythm with PAC's, rate 75, normal intervals  Telemetry sinus rhythm with PAC's, PJC's, PVC's, no sustained atrial arrhythmias   Assessment and Plan 1. Cryptogenic stroke 2. HTN Rec: I have discussed the indication for insertion of an ILR with the patient and he wishes to proceed.   Leonia ReevesGregg Taylor,M.D.

## 2014-06-22 NOTE — Progress Notes (Signed)
UR complete.  Bretton Tandy RN, MSN 

## 2014-06-22 NOTE — CV Procedure (Signed)
Electrophysiology procedure note  Procedure: Insertion of implantable loop recorder  Indication: Cryptogenic stroke  Description of the procedure: After informed consent was obtained, the patient was taken to the diagnostic electrophysiology laboratory in the fasting state. After the usual preparation, 20 cc of lidocaine was infiltrated into the left pectoral region. A 1 cm stab incision was carried out. The Medtronic implantable loop recorder, serial number [id number] was inserted. The R wave measured 1.5 mV. Benzoin and Steri-Strips for pain on the skin, and pressure was held, and the patient was returned to his room in satisfactory condition.  Complications: There were no immediate complications  Results: Successful insertion of an implantable loop recorder in a patient with cryptogenic stroke.  Lewayne BuntingGregg Kirkland Figg, M.D.

## 2014-06-22 NOTE — Progress Notes (Signed)
Pt A&O x4; pt discharge education and instructions completed with pt who voices understanding and denies any questions. Pt IV and telemetry removed; pt loop recorder incision dsg stained but remains dry and intact; pt watched the video on Loop recorder and denies any questions; pt provided handout education on stroke along with his prescription order for Lipitor; Lantus and Novolin insulin. Pt discharge home with brother to transport pt home. Pt transported off unit via chair with belongings at side. Arabella MerlesP. Amo Lan Mcneill RN.

## 2014-06-22 NOTE — Progress Notes (Signed)
Stroke Team Progress Note  HISTORY Stephen Mccullough is a 63 y.o. male who had noted over a few days prior to admission he had intermittent numbness and tingling in his left forearm. He stated he had two episodes a day on average. On 06/18/2014 he had just finished mowing a lawn when he stopped at his house. He went to push the clutch with his left foot and noted he had no strength and could not feel the pedal. At the same time he noted his left hand was week and had no strength. He called his brother who came over and helped him out of the car. Within 15 minutes the symptoms resolved.  He takes ASA daily    Date last known well: Date: 06/18/2014  Time last known well: Time: 11:00  tPA Given: No: symptoms fully resolved.     SUBJECTIVE No family present today. The patient is without complaints. He feels his strength has returned to normal.TEE   done yesterday is normal without evidence of PFO or clot. Loop recorder has not yet been scheduled  OBJECTIVE Most recent Vital Signs: Filed Vitals:   06/21/14 2138 06/22/14 0149 06/22/14 0547 06/22/14 0959  BP: 138/94 133/89 142/85 139/97  Pulse: 83 62  88  Temp: 98.4 F (36.9 C) 97.9 F (36.6 C) 97.7 F (36.5 C) 98.3 F (36.8 C)  TempSrc: Oral Oral Oral Oral  Resp: 18 18 20 20   Height:      Weight:      SpO2: 97% 98% 99% 98%   CBG (last 3)   Recent Labs  06/21/14 1634 06/21/14 2136 06/22/14 0712  GLUCAP 120* 158* 138*    IV Fluid Intake:     MEDICATIONS  . atorvastatin  40 mg Oral q1800  . insulin aspart  0-9 Units Subcutaneous TID WC  . insulin glargine  10 Units Subcutaneous QHS  . pantoprazole  40 mg Oral Daily  . research study medication  100 mg Oral Daily  . research study medication  90 mg Oral BID   PRN:  traMADol  Diet:  Carb Control thin liquids Activity:  Bathroom privileges with assistance DVT Prophylaxis:  SCDs  CLINICALLY SIGNIFICANT STUDIES Basic Metabolic Panel:   Recent Labs Lab 06/18/14 1226  06/21/14 0500  NA 144 141  K 3.9 4.2  CL 106 104  CO2 25 25  GLUCOSE 96 117*  BUN 17 18  CREATININE 1.15 1.23  CALCIUM 9.2 9.1   Liver Function Tests:   Recent Labs Lab 06/18/14 1226  AST 26  ALT 21  ALKPHOS 109  BILITOT 0.4  PROT 7.9  ALBUMIN 4.3   CBC:   Recent Labs Lab 06/18/14 1226 06/21/14 0500  WBC 9.8 10.4  NEUTROABS 5.7  --   HGB 14.5 14.4  HCT 43.5 42.6  MCV 85.1 83.9  PLT 239 213   Coagulation:   Recent Labs Lab 06/18/14 1226  LABPROT 13.4  INR 1.02   Cardiac Enzymes:   Recent Labs Lab 06/18/14 1822 06/19/14 0020 06/19/14 0639  TROPONINI <0.30 <0.30 <0.30   Urinalysis: No results found for this basename: COLORURINE, APPERANCEUR, LABSPEC, PHURINE, GLUCOSEU, HGBUR, BILIRUBINUR, KETONESUR, PROTEINUR, UROBILINOGEN, NITRITE, LEUKOCYTESUR,  in the last 168 hours Lipid Panel    Component Value Date/Time   CHOL 239* 06/18/2014 1409   TRIG 123 06/18/2014 1409   HDL 36* 06/18/2014 1409   CHOLHDL 6.6 06/18/2014 1409   VLDL 25 06/18/2014 1409   LDLCALC 178* 06/18/2014 1409   HgbA1C  Lab  Results  Component Value Date   HGBA1C 6.7* 06/18/2014    Urine Drug Screen:     Component Value Date/Time   LABOPIA NONE DETECTED 06/18/2014 2157   COCAINSCRNUR NONE DETECTED 06/18/2014 2157   LABBENZ NONE DETECTED 06/18/2014 2157   AMPHETMU NONE DETECTED 06/18/2014 2157   THCU NONE DETECTED 06/18/2014 2157   LABBARB NONE DETECTED 06/18/2014 2157    Alcohol Level: No results found for this basename: ETH,  in the last 168 hours  Ct Head (brain) Wo Contrast 06/18/2014    Chronic small vessel disease throughout the deep white matter. No acute intracranial abnormality.       MRI of the brain  06/19/2014 1. Small acute infarct in the right MCA pre motor area affecting white matter. No mass effect or hemorrhage.  2. Questionable small acute cortically based infarct in the right occipital lobe (right PCA territory).  3. Underlying chronic small vessel disease.   MRA of the  brain  06/19/2014 1. Two mm high-grade stenosis of the right MCA proximal M1 segment compatible with thromboembolic disease in this setting, and congruent with the acute findings in #1 above.  2. No major right MCA branch occlusion identified. Otherwise negative anterior circulation.  3. 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 Otherwise negative posterior circulation.    Carotid Doppler - Preliminary report: 1-39% ICA stenosis. Vertebral artery flow is antegrade.    2D Echocardiogram - ejection fraction 55-60%. No cardiac source of emboli identified.  CXR    EKG - SR rate 75 BPM - For complete results please see formal report.   Therapy Recommendations - no further physical or occupational therapy recommended.   Filed Vitals:   06/22/14 0959  BP: 139/97  Pulse: 88  Temp: 98.3 F (36.8 C)  Resp: 20    Physical Exam   Mental Status:  Alert, oriented, thought content appropriate. Speech fluent without evidence of aphasia. Able to follow 3 step commands without difficulty.  Cranial Nerves:  II: Discs not seen; Visual fields grossly normal, pupils equal, round, reactive to light and accommodation  III,IV, VI: ptosis not present, extra-ocular motions intact bilaterally  V,VII: smile symmetric, facial light touch sensation normal bilaterally  VIII: hearing normal bilaterally  IX,X: gag reflex present  XI: bilateral shoulder shrug  XII: midline tongue extension without atrophy or fasciculations  Motor:  Right : Upper extremity 5/5 Left: Upper extremity 5/5  Lower extremity 5/5 Lower extremity 5/5  Tone and bulk:normal tone throughout; no atrophy noted  Sensory: Pinprick and light touch intact throughout, bilaterally  Deep Tendon Reflexes:  Right: Upper Extremity Left: Upper extremity  biceps (C-5 to C-6) 2/4 biceps (C-5 to C-6) 2/4  tricep (C7) 2/4 triceps (C7) 2/4  Brachioradialis (C6) 2/4 Brachioradialis (C6) 2/4  Lower Extremity Lower  Extremity  quadriceps (L-2 to L-4) 1/4 quadriceps (L-2 to L-4) 1/4  Achilles (S1) 0/4 Achilles (S1) 0/4  Plantars:  Right: downgoing Left: downgoing  Cerebellar:  normal finger-to-nose, normal heel-to-shin test  Gait: not tested   ASSESSMENT Mr. Stephen Mccullough is a 63 y.o. male presenting with transient left hemiparesis. No TPA - symptoms resolved. Head CT negative. MRI - small acute infarct in the right MCA pre motor area affecting white matter. Questionable small acute cortically based infarct in the right occipital lobe (right PCA territory). 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 Embolic source unknown. On aspirin 81 mg orally every day prior to admission. Now on study drug for secondary stroke prevention. Patient with resultant resolution  of deficits. Stroke work up underway.   Participating in [employer] Study  Hyperlipidemia - Chol - 239 ; LDL - 178 - Now on Lipitor (no statin PTA)  Htn  DM - HbgA1C 6.7 (goal < 7)  MRA - Two mm high-grade stenosis of the right MCA proximal M1 segment compatible with thromboembolic disease   Hospital day # 4  TREATMENT/PLAN  Continue study drug for secondary stroke prevention.  No followup therapy recommended.  Recommend  loop placement today to look for PAF, Spoke to cardiology and arranged.  Discussed with Dr. Lonell Face  Stroke service will sign off. Follow up as outpatient with Dr Roda Shutters in stroke clinic in 4-6 weeks    Delia Heady, MD        To contact Stroke Continuity provider, please refer to [email or web address]. After hours, contact General Neurology

## 2014-07-01 ENCOUNTER — Ambulatory Visit (INDEPENDENT_AMBULATORY_CARE_PROVIDER_SITE_OTHER): Admitting: *Deleted

## 2014-07-01 DIAGNOSIS — I6350 Cerebral infarction due to unspecified occlusion or stenosis of unspecified cerebral artery: Secondary | ICD-10-CM

## 2014-07-01 LAB — MDC_IDC_ENUM_SESS_TYPE_INCLINIC

## 2014-07-01 NOTE — Progress Notes (Signed)
Wound check ILR.  Steri strips removed by the patient.  Wound well healed.  R-waves .75-1.30mV.  No episodes noted.  To follow up [facility].

## 2014-07-19 ENCOUNTER — Encounter: Payer: Self-pay | Admitting: Internal Medicine

## 2014-07-22 ENCOUNTER — Ambulatory Visit (INDEPENDENT_AMBULATORY_CARE_PROVIDER_SITE_OTHER): Admitting: *Deleted

## 2014-07-22 DIAGNOSIS — I6350 Cerebral infarction due to unspecified occlusion or stenosis of unspecified cerebral artery: Secondary | ICD-10-CM

## 2014-07-29 NOTE — Progress Notes (Signed)
Loop recorder 

## 2014-08-02 LAB — MDC_IDC_ENUM_SESS_TYPE_REMOTE

## 2014-08-03 ENCOUNTER — Ambulatory Visit: Payer: Self-pay | Admitting: Neurology

## 2014-08-16 ENCOUNTER — Telehealth: Payer: Self-pay | Admitting: Neurology

## 2014-08-16 NOTE — Telephone Encounter (Signed)
I am not sure if this message is for me. He was seen by Dr. Pearlean BrownieSethi while he was in hospital in August. I have not seen him before. I do not recall that I called him lately. Is the call from other provider?   Stephen PlanJindong Dezyre Hoefer, MD PhD Stroke Neurology 08/16/2014 6:22 PM

## 2014-08-16 NOTE — Telephone Encounter (Signed)
Patient calling to say that he's returning a call to Dr. Roda ShuttersXu, please call and advise.

## 2014-08-19 ENCOUNTER — Ambulatory Visit (INDEPENDENT_AMBULATORY_CARE_PROVIDER_SITE_OTHER): Admitting: *Deleted

## 2014-08-19 DIAGNOSIS — I63511 Cerebral infarction due to unspecified occlusion or stenosis of right middle cerebral artery: Secondary | ICD-10-CM

## 2014-08-20 ENCOUNTER — Encounter: Payer: Self-pay | Admitting: Internal Medicine

## 2014-08-25 NOTE — Progress Notes (Signed)
Loop recorder 

## 2014-08-26 ENCOUNTER — Encounter: Payer: Self-pay | Admitting: Internal Medicine

## 2014-08-26 ENCOUNTER — Ambulatory Visit: Admitting: *Deleted

## 2014-08-26 DIAGNOSIS — I4891 Unspecified atrial fibrillation: Secondary | ICD-10-CM

## 2014-08-26 LAB — MDC_IDC_ENUM_SESS_TYPE_INCLINIC

## 2014-08-26 NOTE — Progress Notes (Signed)
Loop check in clinic.  Pt with 0 tachy episodes; 0 brady episodes; 0 asystole: 2 AF the longest > 10 hours, - coumadin.  Total burden 0.8%.   Episodes were appropriate/undersensing/oversensing.  Plan to enroll in remote follow up and see in clinic annually.  12 lead EKG done.

## 2014-08-27 LAB — MDC_IDC_ENUM_SESS_TYPE_REMOTE: Date Time Interrogation Session: 20150928040500

## 2014-09-01 ENCOUNTER — Ambulatory Visit (INDEPENDENT_AMBULATORY_CARE_PROVIDER_SITE_OTHER): Admitting: Internal Medicine

## 2014-09-01 ENCOUNTER — Encounter: Payer: Self-pay | Admitting: Internal Medicine

## 2014-09-01 ENCOUNTER — Encounter: Payer: Self-pay | Admitting: Cardiovascular Disease

## 2014-09-01 VITALS — BP 132/86 | HR 75 | Ht 66.0 in | Wt 189.6 lb

## 2014-09-01 DIAGNOSIS — I4891 Unspecified atrial fibrillation: Secondary | ICD-10-CM

## 2014-09-01 DIAGNOSIS — Z959 Presence of cardiac and vascular implant and graft, unspecified: Secondary | ICD-10-CM

## 2014-09-01 DIAGNOSIS — Z95818 Presence of other cardiac implants and grafts: Secondary | ICD-10-CM

## 2014-09-01 DIAGNOSIS — I63511 Cerebral infarction due to unspecified occlusion or stenosis of right middle cerebral artery: Secondary | ICD-10-CM

## 2014-09-01 DIAGNOSIS — I10 Essential (primary) hypertension: Secondary | ICD-10-CM

## 2014-09-01 DIAGNOSIS — E119 Type 2 diabetes mellitus without complications: Secondary | ICD-10-CM | POA: Diagnosis not present

## 2014-09-01 LAB — MDC_IDC_ENUM_SESS_TYPE_INCLINIC
MDC IDC SESS DTM: 20151021110833
MDC IDC SET ZONE DETECTION INTERVAL: 2000 ms
MDC IDC SET ZONE DETECTION INTERVAL: 3000 ms
Zone Setting Detection Interval: 360 ms

## 2014-09-01 MED ORDER — RIVAROXABAN 20 MG PO TABS
20.0000 mg | ORAL_TABLET | Freq: Every day | ORAL | Status: DC
Start: 2014-09-01 — End: 2014-11-17

## 2014-09-01 NOTE — Patient Instructions (Addendum)
Your physician has recommended you make the following change in your medication:  1) START Xarelto 20 mg daily  You have been given handwritten prescription for this medication  Your physician wants you to follow-up in: 1 year with Dr. Ladona Ridgelaylor.  You will receive a reminder letter in the mail two months in advance. If you don't receive a letter, please call our office to schedule the follow-up appointment.

## 2014-09-01 NOTE — Assessment & Plan Note (Signed)
His blood pressure is controlled today. No change in medications. He is encouraged to maintain a low-sodium diet.

## 2014-09-01 NOTE — Assessment & Plan Note (Signed)
The patient had a cryptogenic stroke several months ago, and underwent insertion of an implantable loop recorder. He has been found to have atrial fibrillation. I discussed the risk, goals, benefits, and expectations of initiation of anticoagulation therapy with the patient today. We will start Xarelto.

## 2014-09-01 NOTE — Assessment & Plan Note (Signed)
He will followup with his primary physician.

## 2014-09-01 NOTE — Progress Notes (Signed)
HPI Stephen Mccullough returns today for followup. He is a pleasant 63 yo man with an unexplained stroke who underwent insertion of an implantable loop recorder several months ago. A week ago, interrogation of his loop recorder demonstrated paroxysmal atrial fibrillation. He returns today to discuss initiation of anticoagulation therapy to prevent subsequent strokes. He had no symptoms of atrial fibrillation. His episode occurred on October 8, and lasted 10 hours. He is in sinus rhythm presently. No Known Allergies   Current Outpatient Prescriptions  Medication Sig Dispense Refill  . albuterol (PROVENTIL HFA;VENTOLIN HFA) 108 (90 BASE) MCG/ACT inhaler Inhale 1-2 puffs into the lungs every 6 (six) hours as needed for wheezing or shortness of breath.      Marland Kitchen. atorvastatin (LIPITOR) 40 MG tablet Take 1 tablet (40 mg total) by mouth daily at 6 PM.  30 tablet  0  . insulin glargine (LANTUS) 100 UNIT/ML injection Inject 0.15 mLs (15 Units total) into the skin at bedtime.  10 mL  11  . insulin regular (NOVOLIN R,HUMULIN R) 100 units/mL injection Inject 0.03 mLs (3 Units total) into the skin 3 (three) times daily before meals.  10 mL  11  . lisinopril (PRINIVIL,ZESTRIL) 20 MG tablet Take 20 mg by mouth daily.      Marland Kitchen. omeprazole (PRILOSEC) 20 MG capsule Take 20 mg by mouth daily.      . traMADol (ULTRAM) 50 MG tablet Take 50 mg by mouth every 6 (six) hours as needed for moderate pain.       No current facility-administered medications for this visit.     Past Medical History  Diagnosis Date  . Hypertension   . Diabetes mellitus without complication   . Stroke     2015  . Acute ischemic right MCA stroke   . Essential hypertension   . Mild intermittent asthma   . GERD without esophagitis   . Chronic left shoulder pain     ROS:   All systems reviewed and negative except as noted in the HPI.   Past Surgical History  Procedure Laterality Date  . Appendectomy    . Tee without cardioversion N/A  06/21/2014    Procedure: TRANSESOPHAGEAL ECHOCARDIOGRAM (TEE);  Surgeon: Lars MassonKatarina H Nelson, MD;  [facility];  Service: Cardiovascular;  Laterality: N/A;     Family History  Problem Relation Age of Onset  . Hypertension Mother   . Hypertension Father      History   Social History  . Marital Status: Single    Spouse Name: N/A    Number of Children: N/A  . Years of Education: N/A   Occupational History  . Not on file.   Social History Main Topics  . Smoking status: Never Smoker   . Smokeless tobacco: Not on file  . Alcohol Use: No  . Drug Use: No  . Sexual Activity: Not on file   Other Topics Concern  . Not on file   Social History Narrative  . No narrative on file     There were no vitals taken for this visit.  Physical Exam:  Well appearing NAD HEENT: Unremarkable Neck:  No JVD, no thyromegally Lymphatics:  No adenopathy Back:  No CVA tenderness Lungs:  Clear HEART:  Regular rate rhythm, no murmurs, no rubs, no clicks Abd:  soft, positive bowel sounds, no organomegally, no rebound, no guarding Ext:  2 plus pulses, no edema, no cyanosis, no clubbing Skin:  No rashes no nodules Neuro:  CN  II through XII intact, motor grossly intact except for mild left hemiparesis  DEVICE  Normal device function.  See PaceArt for details. 2 episodes of atrial fibrillation are demonstrated, the longest lasting 10 hours.  Assess/Plan:

## 2014-09-02 ENCOUNTER — Ambulatory Visit (INDEPENDENT_AMBULATORY_CARE_PROVIDER_SITE_OTHER): Payer: TRICARE For Life (TFL) | Admitting: Neurology

## 2014-09-02 ENCOUNTER — Encounter: Payer: Self-pay | Admitting: Neurology

## 2014-09-02 VITALS — BP 121/79 | HR 75 | Ht 66.0 in | Wt 191.0 lb

## 2014-09-02 DIAGNOSIS — E1149 Type 2 diabetes mellitus with other diabetic neurological complication: Secondary | ICD-10-CM

## 2014-09-02 DIAGNOSIS — I10 Essential (primary) hypertension: Secondary | ICD-10-CM

## 2014-09-02 DIAGNOSIS — I63511 Cerebral infarction due to unspecified occlusion or stenosis of right middle cerebral artery: Secondary | ICD-10-CM

## 2014-09-02 DIAGNOSIS — E785 Hyperlipidemia, unspecified: Secondary | ICD-10-CM

## 2014-09-02 NOTE — Patient Instructions (Signed)
-   please continue Xarelto and lipitor for stroke prevention - stop the investigational drug today - continue to follow up with PCP for stroke risk factor modification including DM and HTN and HLD - check BP and glucose at home - will take you off the research study as we have found the cause of your stroke - follow up in 3-4 months.

## 2014-09-03 DIAGNOSIS — E785 Hyperlipidemia, unspecified: Secondary | ICD-10-CM | POA: Insufficient documentation

## 2014-09-03 DIAGNOSIS — E1149 Type 2 diabetes mellitus with other diabetic neurological complication: Secondary | ICD-10-CM | POA: Insufficient documentation

## 2014-09-03 NOTE — Progress Notes (Signed)
STROKE NEUROLOGY FOLLOW UP NOTE  NAME: Stephen Mccullough DOB: [birth year]  REASON FOR VISIT: stroke follow up HISTORY FROM: pt and chart  Today we had the pleasure of seeing Stephen Mccullough in follow-up at our Neurology Clinic. Pt was accompanied by no one.   History Summary Stephen Mccullough is a 63 y.o. male with PMH of HTN, asthma, GERD and DM who was admitted to [facility] on 06/18/14 due to intermittent numbness and tingling in his left forearm days prior to admission and left sided weakness on the admission day. Within 15 minutes the symptoms resolved. He takes ASA daily. His MRI showed small right frontal stroke involving pre-motor area as well as right occipital lobe. MRA showed high grade stenosis of M1, Concerning for embolic stroke. Had TEE negative and put in loop recorder. He was also enrolled to SOCRATES trial and stated on investigational drug ASA vs. Brilinta. He was discharged in good condition.  Interval History During the interval time, the patient has been doing well. No recurrent symptoms, and no neuro deficit so far. He has been followed up by cardiology with loop recording and found to have AF episodes. He was seen by Dr. Ladona Ridgel on 09/01/14 and was put on Xarelto for anticoagulation. Will stop investigational drugs.   REVIEW OF SYSTEMS: Full 14 system review of systems performed and notable only for those listed below and in HPI above, all others are negative:  Constitutional: N/A  Cardiovascular: N/A  Ear/Nose/Throat: N/A  Skin: N/A  Eyes: N/A  Respiratory: N/A  Gastroitestinal: N/A  Genitourinary: N/A Hematology/Lymphatic: N/A  Endocrine: N/A  Musculoskeletal: N/A  Allergy/Immunology: N/A  Neurological: N/A  Psychiatric: N/A  The following represents the patient's updated allergies and side effects list: No Known Allergies  Labs since last visit of relevance include the following: Results for orders placed in visit on 09/01/14  [email or web address]      Result Value  Ref Range   Date Time Interrogation Session [phone]     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[employer]     Pulse Gen Model [id number] Reveal LINQ     Pulse Gen Serial Number [id number]     Zone Setting Type Category VF     Zone Setting Type Category VT     Zone Setting Type Category VENTRICULAR_TACHYCARDIA_1     Zone Detect Interval 360     Zone Setting Type Category VENTRICULAR_TACHYCARDIA_2     Zone Setting Type Category ATRIAL_FIBRILLATION     Zone Setting Type Category ATAF     Zone Setting Type Category ASYSTOLE     Zone Detect Interval 3000     Zone Setting Type Category BRADYCARDIA     Zone Detect Interval 2000     Battery Status OK     Eval Rhythm SR w/PACS     Miscellaneous Comment       Value: Loop check in clinic.  Pt with 0 tachy episodes; 0 brady episodes; 0 asystole; 2 AF episodes noted on prior session---last 10/8 x 10 hours---Xarelto started this ov.   Plan to follow up via [facility] and with GT in 12 months.    The neurologically relevant items on the patient's problem list were reviewed on today's visit.  Neurologic Examination  A problem focused neurological exam (12 or more points of the single system neurologic examination, vital signs counts as 1 point, cranial nerves count for 8 points) was performed.  Blood pressure 121/79, pulse 75, height 5\' 6"  (1.676 m), weight 191 lb (86.637 kg).  General -  Well nourished, well developed, in no apparent distress.  Ophthalmologic - Sharp disc margins OU.  Cardiovascular - Regular rate and rhythm with no murmur.  Mental Status -  Level of arousal and orientation to time, place, and person were intact. Language including expression, naming, repetition, comprehension, reading, and writing was assessed and found intact. Attention span and concentration were normal. Recent and remote memory were intact. Fund of Knowledge was assessed and was intact.  Cranial Nerves II - XII - II - Visual field intact OU. III, IV, VI -  Extraocular movements intact. V - Facial sensation intact bilaterally. VII - Facial movement intact bilaterally. VIII - Hearing & vestibular intact bilaterally. X - Palate elevates symmetrically. XI - Chin turning & shoulder shrug intact bilaterally. XII - Tongue protrusion intact.  Motor Strength - The patient's strength was normal in all extremities and pronator drift was absent.  Bulk was normal and fasciculations were absent.   Motor Tone - Muscle tone was assessed at the neck and appendages and was normal.  Reflexes - The patient's reflexes were normal in all extremities and he had no pathological reflexes.  Sensory - Light touch, temperature/pinprick, vibration and proprioception, and Romberg testing were assessed and were normal.    Coordination - The patient had normal movements in the hands and feet with no ataxia or dysmetria.  Tremor was absent.  Gait and Station - The patient's transfers, posture, gait, station, and turns were observed as normal.  Data reviewed: I personally reviewed the images and agree with the radiology interpretations.  Ct Head (brain) Wo Contrast  06/18/2014  Chronic small vessel disease throughout the deep white matter. No acute intracranial abnormality.  MRI of the brain  06/19/2014  1. Small acute infarct in the right MCA pre motor area affecting white matter. No mass effect or hemorrhage.  2. Questionable small acute cortically based infarct in the right occipital lobe (right PCA territory).  3. Underlying chronic small vessel disease.  MRA of the brain  06/19/2014  1. Two mm high-grade stenosis of the right MCA proximal M1 segment compatible with thromboembolic disease in this setting, and congruent with the acute findings in #1 above.  2. No major right MCA branch occlusion identified. Otherwise negative anterior circulation.  3. 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 Otherwise negative posterior  circulation.  Carotid Doppler - Preliminary report: 1-39% ICA stenosis. Vertebral artery flow is antegrade.  2D Echocardiogram - ejection fraction 55-60%. No cardiac source of emboli identified.  EKG - SR rate 75 BPM Loop recorder - AF episodes found, last one on 10/8 for 10 hours   Component     Latest Ref Rng 06/18/2014  Cholesterol     0 - 200 mg/dL 696239 (H)  Triglycerides     <150 mg/dL 295123  HDL     >28>39 mg/dL 36 (L)  Total CHOL/HDL Ratio      6.6  VLDL     0 - 40 mg/dL 25  LDL (calc)     0 - 99 mg/dL 413178 (H)  Hemoglobin K4MA1C     <5.7 % 6.7 (H)  Mean Plasma Glucose     <117 mg/dL 010146 (H)    Assessment: As you may recall, he is a 63 y.o. Caucasian male with PMH of HTN and DM and asthma was admitted for left sided weakness and intermittent numbness. MRI showed right frontal and occipital infarcts. MRA showed right M1 high grade stenosis. TEE negative and loop recorder showed AF  episodes. Started on Xarelto recently. Will take him off SOCRATES trial and continue Xarelto for anticoagulation. Continue lipitor and treat for risk factors.  Plan:  - continue Xarelto and lipitor for stroke prevention - continue lantus and lisinopril for risk factor control. - Follow up with your primary care physician for stroke risk factor modification. Recommend maintain blood pressure goal <130/80, diabetes with hemoglobin A1c goal below 6.5% and lipids with LDL cholesterol goal below 70 mg/dL.  - check BP and glucose at home - take off from SOCRATES trial - RTC in 3-4 months.  No orders of the defined types were placed in this encounter.    No orders of the defined types were placed in this encounter.    Patient Instructions  - please continue Xarelto and lipitor for stroke prevention - stop the investigational drug today - continue to follow up with PCP for stroke risk factor modification including DM and HTN and HLD - check BP and glucose at home - will take you off the research study as we  have found the cause of your stroke - follow up in 3-4 months.   Marvel PlanJindong Shawnita Krizek, MD PhD [facility] [address], [address] [place], [place] [address] [phone]

## 2014-09-09 ENCOUNTER — Encounter: Payer: Self-pay | Admitting: Internal Medicine

## 2014-09-17 ENCOUNTER — Encounter: Payer: Self-pay | Admitting: Internal Medicine

## 2014-09-20 ENCOUNTER — Ambulatory Visit (INDEPENDENT_AMBULATORY_CARE_PROVIDER_SITE_OTHER): Admitting: *Deleted

## 2014-09-20 DIAGNOSIS — I63511 Cerebral infarction due to unspecified occlusion or stenosis of right middle cerebral artery: Secondary | ICD-10-CM

## 2014-09-20 LAB — MDC_IDC_ENUM_SESS_TYPE_REMOTE
Date Time Interrogation Session: 20151118174544
Zone Setting Detection Interval: 2000 ms
Zone Setting Detection Interval: 3000 ms
Zone Setting Detection Interval: 360 ms

## 2014-09-27 ENCOUNTER — Telehealth: Payer: Self-pay | Admitting: Neurology

## 2014-09-27 ENCOUNTER — Ambulatory Visit (INDEPENDENT_AMBULATORY_CARE_PROVIDER_SITE_OTHER): Payer: Self-pay | Admitting: Neurology

## 2014-09-27 DIAGNOSIS — G459 Transient cerebral ischemic attack, unspecified: Secondary | ICD-10-CM

## 2014-09-27 NOTE — Telephone Encounter (Signed)
I spoke to the patient to schedule his Visit 5 for the Socrates Trial. Patient mentioned that he would be going out of town tomorrow 17NOV2015. Therefore, I asked the patient if he could come by the office today 16NOV2015, and patient concurred. Appointment was scheduled for Today, 16NOV2015 at 16:45h.

## 2014-09-27 NOTE — Progress Notes (Signed)
I personally examined patient, spoke to him, answered questions. Agree with xarelto for new onset atrial fibrillation.Physical and neurological exam is normal. NIHSS 0. Modified rankin scale 0 Advised f/u in 3 months

## 2014-09-29 NOTE — Progress Notes (Signed)
Loop recorder 

## 2014-10-14 ENCOUNTER — Encounter: Payer: Self-pay | Admitting: Internal Medicine

## 2014-10-20 ENCOUNTER — Ambulatory Visit (INDEPENDENT_AMBULATORY_CARE_PROVIDER_SITE_OTHER): Admitting: *Deleted

## 2014-10-20 DIAGNOSIS — I63511 Cerebral infarction due to unspecified occlusion or stenosis of right middle cerebral artery: Secondary | ICD-10-CM

## 2014-10-21 ENCOUNTER — Encounter (HOSPITAL_COMMUNITY): Payer: Self-pay | Admitting: Internal Medicine

## 2014-10-22 NOTE — Progress Notes (Signed)
Loop recorder 

## 2014-11-10 LAB — MDC_IDC_ENUM_SESS_TYPE_REMOTE

## 2014-11-15 ENCOUNTER — Ambulatory Visit (INDEPENDENT_AMBULATORY_CARE_PROVIDER_SITE_OTHER): Admitting: *Deleted

## 2014-11-15 DIAGNOSIS — I63511 Cerebral infarction due to unspecified occlusion or stenosis of right middle cerebral artery: Secondary | ICD-10-CM

## 2014-11-17 ENCOUNTER — Other Ambulatory Visit: Payer: Self-pay | Admitting: *Deleted

## 2014-11-17 MED ORDER — RIVAROXABAN 20 MG PO TABS: 20.0000 mg | ORAL_TABLET | Freq: Every day | ORAL | Status: DC

## 2014-11-23 ENCOUNTER — Encounter: Payer: Self-pay | Admitting: Internal Medicine

## 2014-11-26 NOTE — Progress Notes (Signed)
Loop recorder 

## 2014-12-14 LAB — MDC_IDC_ENUM_SESS_TYPE_REMOTE

## 2014-12-20 ENCOUNTER — Ambulatory Visit (INDEPENDENT_AMBULATORY_CARE_PROVIDER_SITE_OTHER): Admitting: *Deleted

## 2014-12-20 DIAGNOSIS — I63511 Cerebral infarction due to unspecified occlusion or stenosis of right middle cerebral artery: Secondary | ICD-10-CM

## 2014-12-20 NOTE — Progress Notes (Signed)
Loop recorder 

## 2014-12-21 LAB — MDC_IDC_ENUM_SESS_TYPE_REMOTE

## 2014-12-28 ENCOUNTER — Encounter: Payer: Self-pay | Admitting: Cardiology

## 2014-12-29 ENCOUNTER — Encounter: Payer: Self-pay | Admitting: Internal Medicine

## 2015-01-03 ENCOUNTER — Ambulatory Visit: Payer: TRICARE For Life (TFL) | Admitting: Neurology

## 2015-01-17 ENCOUNTER — Encounter: Payer: Self-pay | Admitting: Internal Medicine

## 2015-01-18 ENCOUNTER — Ambulatory Visit (INDEPENDENT_AMBULATORY_CARE_PROVIDER_SITE_OTHER): Admitting: *Deleted

## 2015-01-18 DIAGNOSIS — I63511 Cerebral infarction due to unspecified occlusion or stenosis of right middle cerebral artery: Secondary | ICD-10-CM

## 2015-01-20 NOTE — Progress Notes (Signed)
Loop recorder 

## 2015-02-02 LAB — MDC_IDC_ENUM_SESS_TYPE_REMOTE

## 2015-02-18 ENCOUNTER — Ambulatory Visit (INDEPENDENT_AMBULATORY_CARE_PROVIDER_SITE_OTHER): Admitting: *Deleted

## 2015-02-18 DIAGNOSIS — I63511 Cerebral infarction due to unspecified occlusion or stenosis of right middle cerebral artery: Secondary | ICD-10-CM | POA: Diagnosis not present

## 2015-02-21 NOTE — Progress Notes (Signed)
Loop recorder 

## 2015-02-25 ENCOUNTER — Encounter: Payer: Self-pay | Admitting: Internal Medicine

## 2015-03-18 LAB — CUP PACEART REMOTE DEVICE CHECK: Date Time Interrogation Session: 20160331040500

## 2015-03-21 ENCOUNTER — Ambulatory Visit (INDEPENDENT_AMBULATORY_CARE_PROVIDER_SITE_OTHER): Admitting: *Deleted

## 2015-03-21 DIAGNOSIS — I63511 Cerebral infarction due to unspecified occlusion or stenosis of right middle cerebral artery: Secondary | ICD-10-CM | POA: Diagnosis not present

## 2015-03-25 NOTE — Progress Notes (Signed)
Loop recorder 

## 2015-04-01 ENCOUNTER — Encounter: Payer: Self-pay | Admitting: Internal Medicine

## 2015-04-12 LAB — CUP PACEART REMOTE DEVICE CHECK: Date Time Interrogation Session: 20160531165654

## 2015-04-29 ENCOUNTER — Encounter: Payer: Self-pay | Admitting: Internal Medicine

## 2015-05-19 ENCOUNTER — Ambulatory Visit (INDEPENDENT_AMBULATORY_CARE_PROVIDER_SITE_OTHER): Admitting: *Deleted

## 2015-05-19 DIAGNOSIS — I63511 Cerebral infarction due to unspecified occlusion or stenosis of right middle cerebral artery: Secondary | ICD-10-CM | POA: Diagnosis not present

## 2015-05-19 NOTE — Progress Notes (Signed)
Loop recorder 

## 2015-05-20 LAB — CUP PACEART REMOTE DEVICE CHECK: Date Time Interrogation Session: 20160708112107

## 2015-06-16 ENCOUNTER — Encounter: Payer: Self-pay | Admitting: Internal Medicine

## 2015-06-17 ENCOUNTER — Ambulatory Visit (INDEPENDENT_AMBULATORY_CARE_PROVIDER_SITE_OTHER): Admitting: *Deleted

## 2015-06-17 DIAGNOSIS — I63511 Cerebral infarction due to unspecified occlusion or stenosis of right middle cerebral artery: Secondary | ICD-10-CM

## 2015-06-21 NOTE — Progress Notes (Signed)
Loop recorder 

## 2015-06-27 LAB — CUP PACEART REMOTE DEVICE CHECK: Date Time Interrogation Session: 20160815081432

## 2015-07-13 ENCOUNTER — Encounter: Payer: Self-pay | Admitting: Internal Medicine

## 2015-07-19 ENCOUNTER — Ambulatory Visit (INDEPENDENT_AMBULATORY_CARE_PROVIDER_SITE_OTHER): Admitting: *Deleted

## 2015-07-19 DIAGNOSIS — I63511 Cerebral infarction due to unspecified occlusion or stenosis of right middle cerebral artery: Secondary | ICD-10-CM | POA: Diagnosis not present

## 2015-07-20 NOTE — Progress Notes (Signed)
Loop recorder 

## 2015-07-30 LAB — CUP PACEART REMOTE DEVICE CHECK: MDC IDC SESS DTM: 20160917153023

## 2015-07-30 NOTE — Progress Notes (Signed)
Carelink summary report received. Battery status OK. Normal device function. No new symptom episodes, tachy episodes, brady, or pause episodes. No new AF episodes, Xarelto. Monthly summary reports and ROV with GT on 08/03/15 at 2:15pm.

## 2015-08-03 ENCOUNTER — Encounter: Payer: Self-pay | Admitting: Internal Medicine

## 2015-08-03 ENCOUNTER — Ambulatory Visit (INDEPENDENT_AMBULATORY_CARE_PROVIDER_SITE_OTHER): Admitting: Internal Medicine

## 2015-08-03 VITALS — BP 140/82 | HR 86 | Ht 66.0 in | Wt 191.0 lb

## 2015-08-03 DIAGNOSIS — I63511 Cerebral infarction due to unspecified occlusion or stenosis of right middle cerebral artery: Secondary | ICD-10-CM | POA: Diagnosis not present

## 2015-08-03 DIAGNOSIS — I10 Essential (primary) hypertension: Secondary | ICD-10-CM

## 2015-08-03 DIAGNOSIS — I4891 Unspecified atrial fibrillation: Secondary | ICD-10-CM | POA: Insufficient documentation

## 2015-08-03 DIAGNOSIS — I480 Paroxysmal atrial fibrillation: Secondary | ICD-10-CM

## 2015-08-03 DIAGNOSIS — E785 Hyperlipidemia, unspecified: Secondary | ICD-10-CM | POA: Diagnosis not present

## 2015-08-03 LAB — CUP PACEART INCLINIC DEVICE CHECK
Date Time Interrogation Session: 20160921143119
MDC IDC SET ZONE DETECTION INTERVAL: 3000 ms
Zone Setting Detection Interval: 2000 ms
Zone Setting Detection Interval: 360 ms

## 2015-08-03 NOTE — Assessment & Plan Note (Signed)
His blood pressure is reasonably well controlled. Will follow.

## 2015-08-03 NOTE — Patient Instructions (Signed)
Medication Instructions: - no changes  Labwork: - none  Procedures/Testing: - none   Follow-Up: - Dr. Taylor will see you back on an as needed basis.  Any Additional Special Instructions Will Be Listed Below (If Applicable). - none  

## 2015-08-03 NOTE — Assessment & Plan Note (Signed)
His ILR demonstrates that he only rarely goes into atrial fib and he is minimally symptomatic. He will continue anti-coagulation.

## 2015-08-03 NOTE — Progress Notes (Signed)
HPI Stephen Mccullough returns today for followup. He is a pleasant 64 yo man with an unexplained stroke who underwent insertion of an implantable loop recorder several months ago. A has been found to have atrial fib for which he is minimally symptomatic. He was begun on systemic anti-coagulation almost a year ago. He returns today for followup. He is planning to move back to [place] state to be closer to his sons.   No Known Allergies   Current Outpatient Prescriptions  Medication Sig Dispense Refill  . albuterol (PROVENTIL HFA;VENTOLIN HFA) 108 (90 BASE) MCG/ACT inhaler Inhale 1-2 puffs into the lungs every 6 (six) hours as needed for wheezing or shortness of breath.    Marland Kitchen atorvastatin (LIPITOR) 40 MG tablet Take 1 tablet (40 mg total) by mouth daily at 6 PM. 30 tablet 0  . insulin glargine (LANTUS) 100 UNIT/ML injection Inject 0.15 mLs (15 Units total) into the skin at bedtime. 10 mL 11  . insulin regular (NOVOLIN R,HUMULIN R) 100 units/mL injection Inject 0.03 mLs (3 Units total) into the skin 3 (three) times daily before meals. 10 mL 11  . lisinopril (PRINIVIL,ZESTRIL) 20 MG tablet Take 20 mg by mouth daily.    Marland Kitchen omeprazole (PRILOSEC) 20 MG capsule Take 20 mg by mouth daily.    . rivaroxaban (XARELTO) 20 MG TABS tablet Take 1 tablet (20 mg total) by mouth daily with supper. 90 tablet 3  . traMADol (ULTRAM) 50 MG tablet Take 50 mg by mouth every 6 (six) hours as needed for moderate pain.     No current facility-administered medications for this visit.     Past Medical History  Diagnosis Date  . Hypertension   . Diabetes mellitus without complication   . Stroke     2015  . Acute ischemic right MCA stroke   . Essential hypertension   . Mild intermittent asthma   . GERD without esophagitis   . Chronic left shoulder pain     ROS:   All systems reviewed and negative except as noted in the HPI.   Past Surgical History  Procedure Laterality Date  . Appendectomy    . Tee  without cardioversion N/A 06/21/2014    Procedure: TRANSESOPHAGEAL ECHOCARDIOGRAM (TEE);  Surgeon: Lars Masson, MD;  Location: [facility] ENDOSCOPY;  Service: Cardiovascular;  Laterality: N/A;  . Loop recorder implant N/A 06/22/2014    Procedure: LOOP RECORDER IMPLANT;  Surgeon: Marinus Maw, MD;  Location[facility] CATH LAB;  Service: Cardiovascular;  Laterality: N/A;     Family History  Problem Relation Age of Onset  . Hypertension Mother   . Hypertension Father      Social History   Social History  . Marital Status: Single    Spouse Name: N/A  . Number of Children: 2  . Years of Education: 12   Occupational History  . Not on file.   Social History Main Topics  . Smoking status: Never Smoker   . Smokeless tobacco: Never Used  . Alcohol Use: No  . Drug Use: No  . Sexual Activity: Not on file   Other Topics Concern  . Not on file   Social History Narrative   Patient is single with 2 children.   Patient is right handed.   Patient has 12 th grade education.   Patient does not drink caffeine.     BP 140/82 mmHg  Pulse 86  Ht  (1.676 m)  Wt 191 lb (86.637 kg)  BMI 30.84 kg/m2  Physical Exam:  Well appearing 64 yo man, NAD HEENT: Unremarkable Neck:  6 cm JVD, no thyromegally Lymphatics:  No adenopathy Back:  No CVA tenderness Lungs:  Clear with no wheezes HEART:  Regular rate rhythm, no murmurs, no rubs, no clicks Abd:  soft, positive bowel sounds, no organomegally, no rebound, no guarding Ext:  2 plus pulses, no edema, no cyanosis, no clubbing Skin:  No rashes no nodules Neuro:  CN II through XII intact, motor grossly intact except for mild left hemiparesis  DEVICE  Normal device function.  See PaceArt for details. Normal ILR function.  Assess/Plan:

## 2015-08-03 NOTE — Assessment & Plan Note (Signed)
He will be maintained on systemic anti-coagulation. Will follow.

## 2015-08-03 NOTE — Assessment & Plan Note (Signed)
He will continue Atorvastatin. He will maintain a low fat diet and lose weight.

## 2015-08-17 ENCOUNTER — Encounter: Payer: Self-pay | Admitting: Internal Medicine

## 2015-08-17 ENCOUNTER — Ambulatory Visit (INDEPENDENT_AMBULATORY_CARE_PROVIDER_SITE_OTHER): Admitting: *Deleted

## 2015-08-17 DIAGNOSIS — I63511 Cerebral infarction due to unspecified occlusion or stenosis of right middle cerebral artery: Secondary | ICD-10-CM | POA: Diagnosis not present

## 2015-08-17 NOTE — Progress Notes (Signed)
Loop recorder 

## 2015-08-28 LAB — CUP PACEART REMOTE DEVICE CHECK: Date Time Interrogation Session: 20161005050614

## 2015-08-28 NOTE — Progress Notes (Signed)
Carelink summary report received. Battery status OK. Normal device function. No new symptom episodes, tachy episodes, brady, or pause episodes. No new AF episodes, +Xarelto. Monthly summary reports and ROV with Gt in 07/2016.

## 2015-09-13 ENCOUNTER — Encounter: Payer: Self-pay | Admitting: Internal Medicine

## 2015-09-16 ENCOUNTER — Ambulatory Visit (INDEPENDENT_AMBULATORY_CARE_PROVIDER_SITE_OTHER): Admitting: *Deleted

## 2015-09-16 DIAGNOSIS — I63511 Cerebral infarction due to unspecified occlusion or stenosis of right middle cerebral artery: Secondary | ICD-10-CM | POA: Diagnosis not present

## 2015-09-16 NOTE — Progress Notes (Signed)
Carelink Summary Report / Loop recorder 

## 2015-10-16 LAB — CUP PACEART REMOTE DEVICE CHECK: Date Time Interrogation Session: 20161104050728

## 2015-10-16 NOTE — Progress Notes (Signed)
Carelink summary report received. Battery status OK. Normal device function. No new symptom episodes, tachy episodes, brady, or pause episodes. No new AF episodes, +Xarelto. Monthly summary reports and ROV with GT in 07/2016.

## 2015-10-17 ENCOUNTER — Ambulatory Visit (INDEPENDENT_AMBULATORY_CARE_PROVIDER_SITE_OTHER): Admitting: *Deleted

## 2015-10-17 DIAGNOSIS — I63511 Cerebral infarction due to unspecified occlusion or stenosis of right middle cerebral artery: Secondary | ICD-10-CM | POA: Diagnosis not present

## 2015-10-17 NOTE — Progress Notes (Signed)
Carelink Summary Report / Loop Recorder 

## 2015-11-10 ENCOUNTER — Encounter: Payer: Self-pay | Admitting: Cardiology

## 2015-11-15 ENCOUNTER — Ambulatory Visit (INDEPENDENT_AMBULATORY_CARE_PROVIDER_SITE_OTHER): Admitting: *Deleted

## 2015-11-15 DIAGNOSIS — I63511 Cerebral infarction due to unspecified occlusion or stenosis of right middle cerebral artery: Secondary | ICD-10-CM | POA: Diagnosis not present

## 2015-11-15 NOTE — Progress Notes (Signed)
Carelink Summary Report / Loop Recorder 

## 2015-11-19 ENCOUNTER — Other Ambulatory Visit: Payer: Self-pay | Admitting: Internal Medicine

## 2015-12-13 LAB — CUP PACEART REMOTE DEVICE CHECK: MDC IDC SESS DTM: 20161204050608

## 2015-12-15 ENCOUNTER — Ambulatory Visit (INDEPENDENT_AMBULATORY_CARE_PROVIDER_SITE_OTHER): Admitting: *Deleted

## 2015-12-15 DIAGNOSIS — I63511 Cerebral infarction due to unspecified occlusion or stenosis of right middle cerebral artery: Secondary | ICD-10-CM

## 2015-12-15 NOTE — Progress Notes (Signed)
Carelink Summary Report / Loop Recorder 

## 2015-12-17 LAB — CUP PACEART REMOTE DEVICE CHECK: Date Time Interrogation Session: 20170103053712

## 2016-01-13 ENCOUNTER — Encounter: Payer: Self-pay | Admitting: Cardiology

## 2016-01-16 ENCOUNTER — Ambulatory Visit (INDEPENDENT_AMBULATORY_CARE_PROVIDER_SITE_OTHER): Admitting: *Deleted

## 2016-01-16 DIAGNOSIS — I63511 Cerebral infarction due to unspecified occlusion or stenosis of right middle cerebral artery: Secondary | ICD-10-CM | POA: Diagnosis not present

## 2016-01-17 LAB — CUP PACEART REMOTE DEVICE CHECK: MDC IDC SESS DTM: 20170202060542

## 2016-01-17 NOTE — Progress Notes (Signed)
Carelink summary report received. Battery status OK. Normal device function. No new symptom episodes, tachy episodes, brady, or pause episodes. No new AF episodes. Monthly summary reports and ROV/PRN 

## 2016-01-19 NOTE — Progress Notes (Signed)
Carelink Summary Report / Loop Recorder 

## 2016-01-21 LAB — CUP PACEART REMOTE DEVICE CHECK: MDC IDC SESS DTM: 20170304063545

## 2016-01-21 NOTE — Progress Notes (Signed)
Carelink summary report received. Battery status OK. Normal device function. No new symptom episodes, tachy episodes, brady, or pause episodes. No new AF episodes. Monthly summary reports and ROV/PRN 

## 2016-02-03 ENCOUNTER — Telehealth: Payer: Self-pay | Admitting: Cardiology

## 2016-02-03 NOTE — Telephone Encounter (Signed)
Spoke w/ pt and requested that he send a manual transmission. Pt verbalized understanding.

## 2016-02-13 ENCOUNTER — Ambulatory Visit (INDEPENDENT_AMBULATORY_CARE_PROVIDER_SITE_OTHER): Admitting: *Deleted

## 2016-02-13 DIAGNOSIS — I63511 Cerebral infarction due to unspecified occlusion or stenosis of right middle cerebral artery: Secondary | ICD-10-CM | POA: Diagnosis not present

## 2016-02-13 NOTE — Progress Notes (Signed)
Carelink Summary Report / Loop Recorder 

## 2016-03-14 ENCOUNTER — Ambulatory Visit (INDEPENDENT_AMBULATORY_CARE_PROVIDER_SITE_OTHER): Admitting: *Deleted

## 2016-03-14 DIAGNOSIS — I63511 Cerebral infarction due to unspecified occlusion or stenosis of right middle cerebral artery: Secondary | ICD-10-CM

## 2016-03-15 NOTE — Progress Notes (Signed)
Carelink Summary Report / Loop Recorder 

## 2016-04-07 LAB — CUP PACEART REMOTE DEVICE CHECK: Date Time Interrogation Session: 20170403063606

## 2016-04-07 NOTE — Progress Notes (Signed)
Carelink summary report received. Battery status OK. Normal device function. No new symptom episodes, tachy episodes, brady, or pause episodes. No new AF episodes. Monthly summary reports and ROV/PRN 

## 2016-04-13 ENCOUNTER — Ambulatory Visit (INDEPENDENT_AMBULATORY_CARE_PROVIDER_SITE_OTHER): Admitting: *Deleted

## 2016-04-13 DIAGNOSIS — I63511 Cerebral infarction due to unspecified occlusion or stenosis of right middle cerebral artery: Secondary | ICD-10-CM

## 2016-04-13 NOTE — Progress Notes (Signed)
Carelink Summary Report / Loop Recorder 

## 2016-04-22 LAB — CUP PACEART REMOTE DEVICE CHECK: Date Time Interrogation Session: 20170503070542

## 2016-04-22 NOTE — Progress Notes (Signed)
Carelink summary report received. Battery status OK. Normal device function. No new symptom episodes, tachy episodes, brady, or pause episodes. No new AF episodes. Monthly summary reports and ROV/PRN 

## 2016-05-14 ENCOUNTER — Ambulatory Visit (INDEPENDENT_AMBULATORY_CARE_PROVIDER_SITE_OTHER): Admitting: *Deleted

## 2016-05-14 DIAGNOSIS — I63511 Cerebral infarction due to unspecified occlusion or stenosis of right middle cerebral artery: Secondary | ICD-10-CM | POA: Diagnosis not present

## 2016-05-14 NOTE — Progress Notes (Signed)
Carelink Summary Report / Loop Recorder 

## 2016-05-22 LAB — CUP PACEART REMOTE DEVICE CHECK: Date Time Interrogation Session: 20170602073521

## 2016-05-29 LAB — CUP PACEART REMOTE DEVICE CHECK: MDC IDC SESS DTM: 20170702080533

## 2016-06-12 ENCOUNTER — Ambulatory Visit (INDEPENDENT_AMBULATORY_CARE_PROVIDER_SITE_OTHER): Admitting: *Deleted

## 2016-06-12 DIAGNOSIS — I63511 Cerebral infarction due to unspecified occlusion or stenosis of right middle cerebral artery: Secondary | ICD-10-CM

## 2016-06-12 NOTE — Progress Notes (Signed)
Carelink Summary Report / Loop Recorder 

## 2016-06-21 LAB — CUP PACEART REMOTE DEVICE CHECK: MDC IDC SESS DTM: 20170801080549

## 2016-06-29 ENCOUNTER — Telehealth: Payer: Self-pay | Admitting: Cardiology

## 2016-06-29 NOTE — Telephone Encounter (Signed)
LMOVM requesting that pt send manual transmission b/c home monitor has not updated in at least 14 days.    

## 2016-07-11 IMAGING — MR MR MRA HEAD W/O CM
11 of 13 series · 32 of 48 positions shown · non-contrast
Comparison: Head CT without contrast 06/18/2014.

CLINICAL DATA: 63-year-old male with left side weakness and
numbness plus slurred speech. Symptoms resolved after 15 min.
Initial encounter.

EXAM:
MRI HEAD WITHOUT CONTRAST
MRA HEAD WITHOUT CONTRAST
TECHNIQUE: Multiplanar, multiecho pulse sequences of the brain and surrounding
structures were obtained without intravenous contrast. Angiographic
images of the head were obtained using MRA technique without
contrast.

[Series 3: T1 · sagittal · 5.0mm · 0.47mm/px · 1 of 22 slices shown]
[im 1/22]
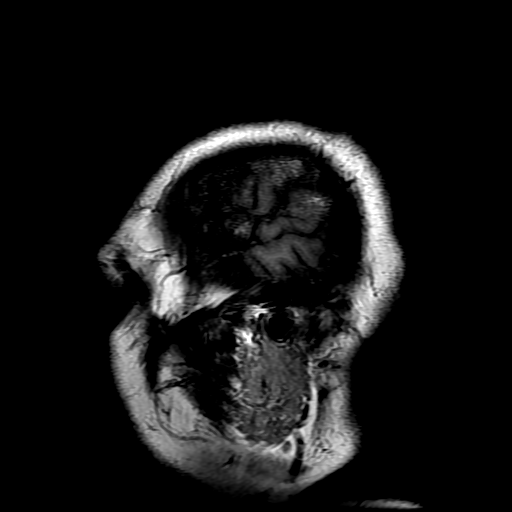

[Series 4: DWI · axial · 5.0mm · 1.09mm/px · z∈[-67,+77]mm · 4 of 60 slices shown (1 of 6)]
[im 1/60]
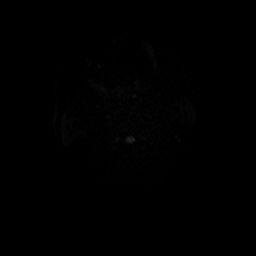
[im 20/60]
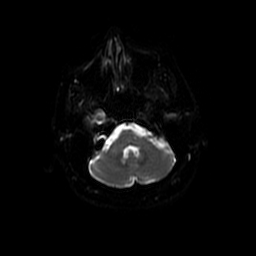
[im 40/60]
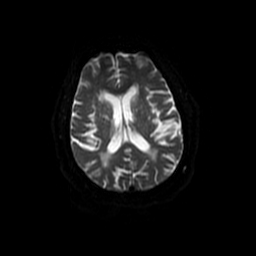
[im 60/60]
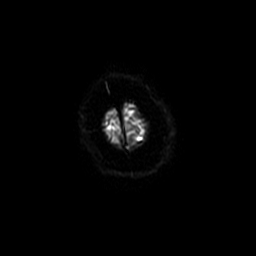

[Series 5: DWI · coronal · 5.0mm · 1.09mm/px · 4 of 62 slices shown (2 of 6)]
[im 1/62]
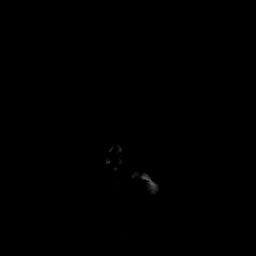
[im 21/62]
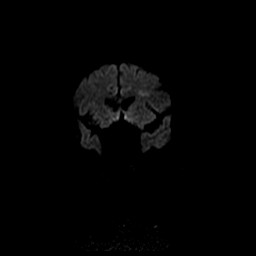
[im 41/62]
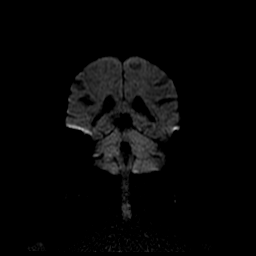
[im 62/62]
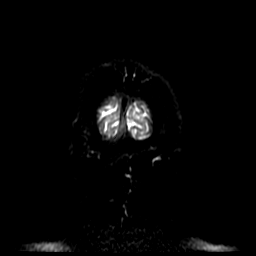

[Series 6: (id) mt fs · axial · 1.4mm · 0.43mm/px · z∈[-58,-14]mm · 4 of 160 slices shown]
[im 1/160]
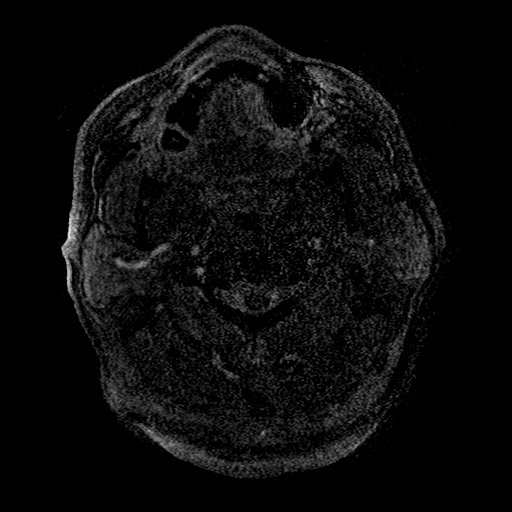
[im 32/160]
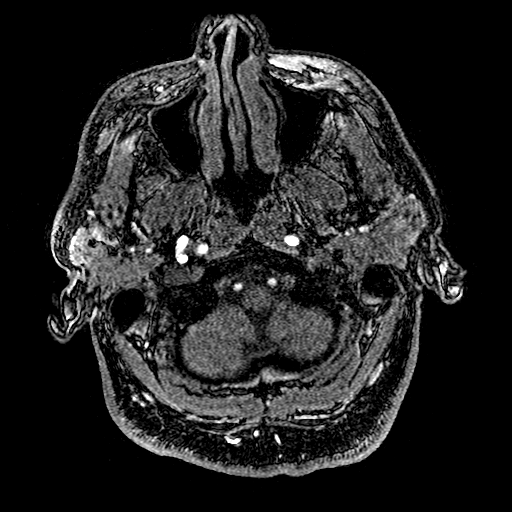
[im 48/160]
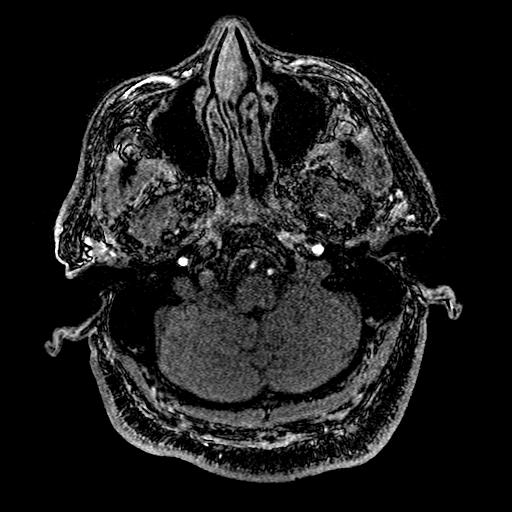
[im 64/160]
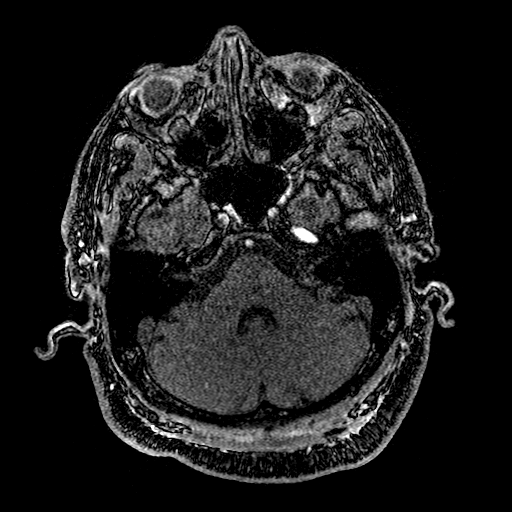

[Series 7: T2 · axial · 5.0mm · 0.43mm/px · z∈[-58,+79]mm · 2 of 24 slices shown (1 of 2)]
[im 1/24]
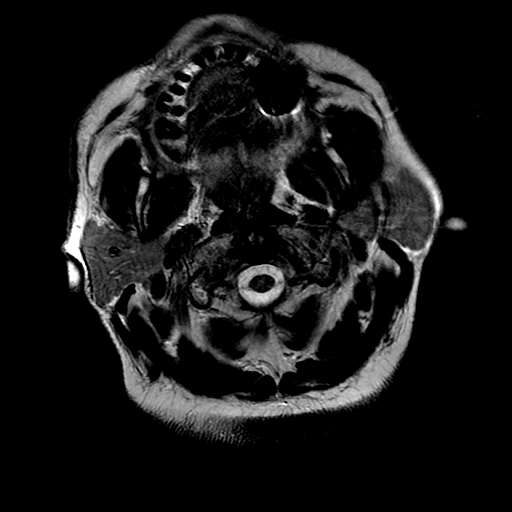
[im 24/24]
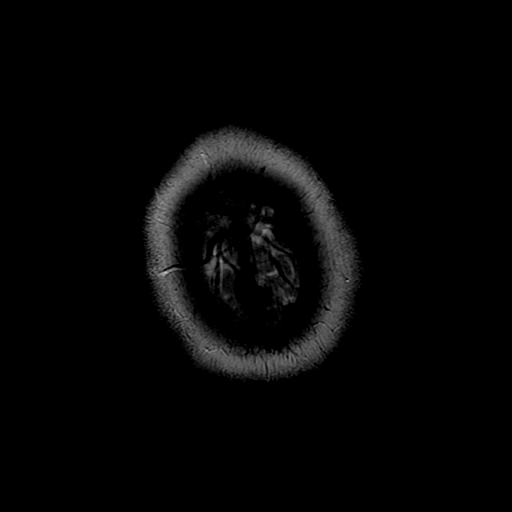

[Series 8: FLAIR · axial · 5.0mm · 0.43mm/px · z∈[-58,+79]mm · 2 of 24 slices shown]
[im 1/24]
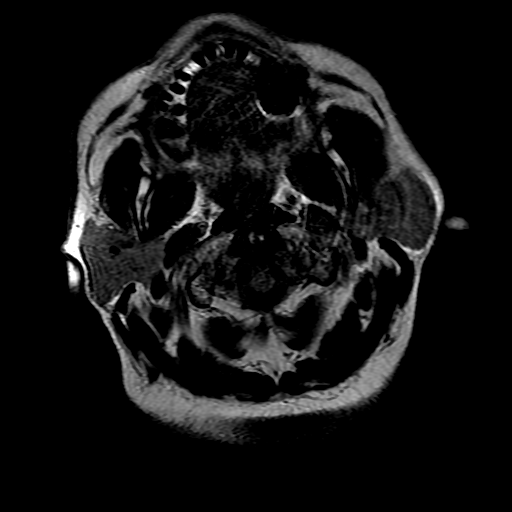
[im 24/24]
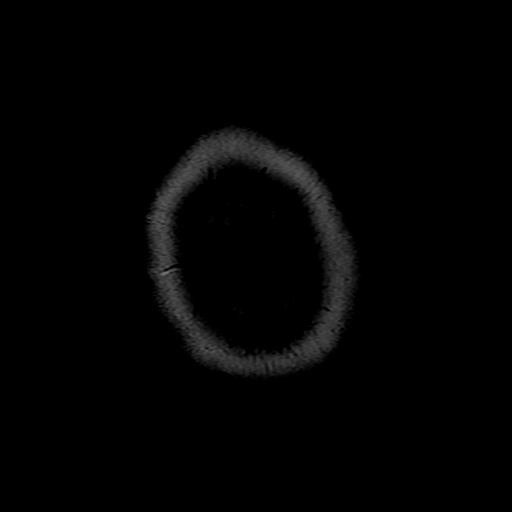

[Series 11: DWI · axial · 3.0mm · 1.09mm/px · z∈[-61,+74]mm · 6 of 92 slices shown (3 of 6)]
[im 1/92]
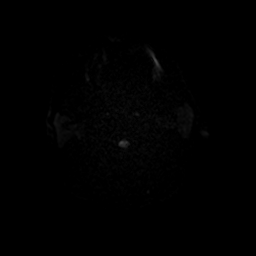
[im 19/92]
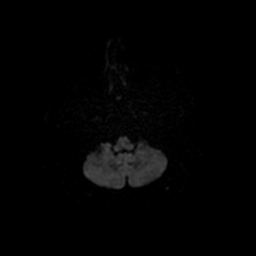
[im 37/92]
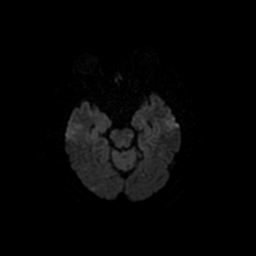
[im 55/92]
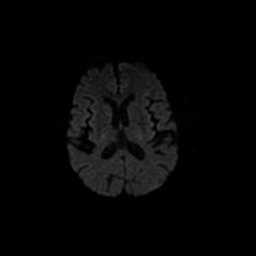
[im 73/92]
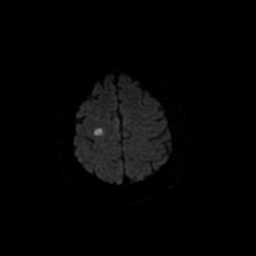
[im 92/92]
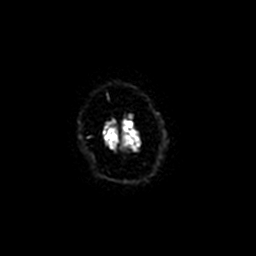

[Series 12: T2 · coronal · 5.0mm · 0.43mm/px · 2 of 27 slices shown (2 of 2)]
[im 1/27]
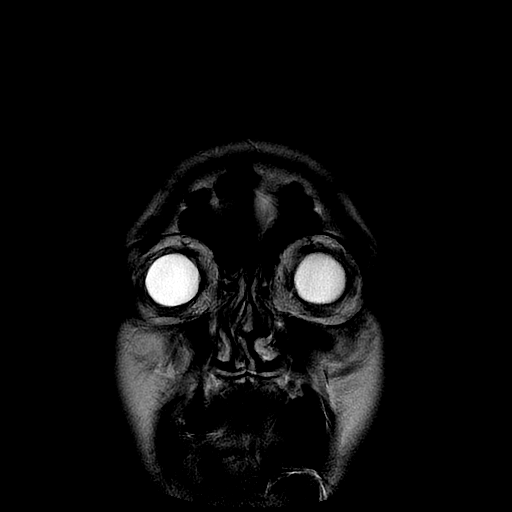
[im 27/27]
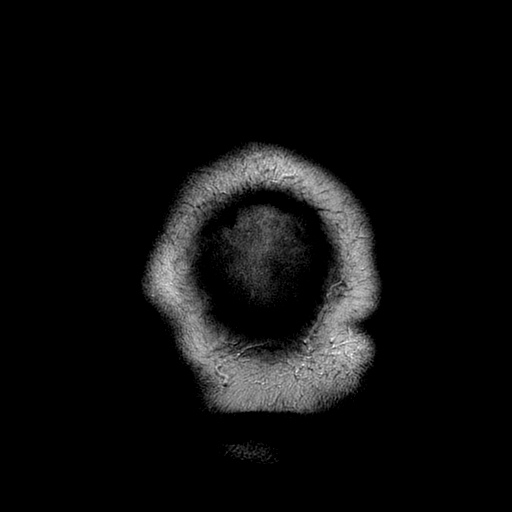

[Series 400: DWI · axial · 5.0mm · 1.09mm/px · z∈[-67,+77]mm · 2 of 30 slices shown (4 of 6)]
[im 1/30]
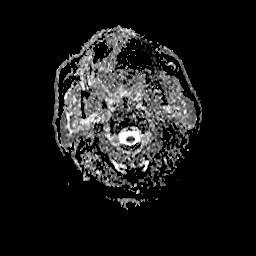
[im 30/30]
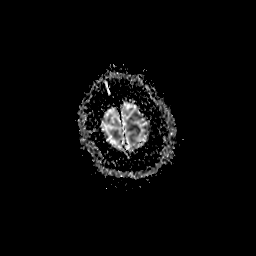

[Series 500: DWI · coronal · 5.0mm · 1.09mm/px · 2 of 31 slices shown (5 of 6)]
[im 1/31]
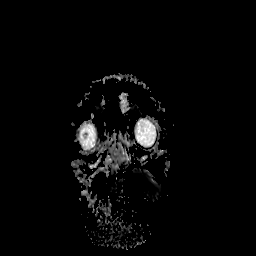
[im 31/31]
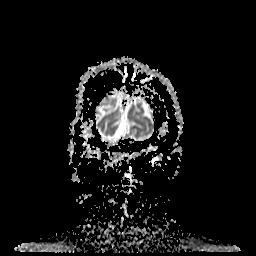

[Series 1100: DWI · axial · 3.0mm · 1.09mm/px · z∈[-61,+74]mm · 3 of 46 slices shown (6 of 6)]
[im 1/46]
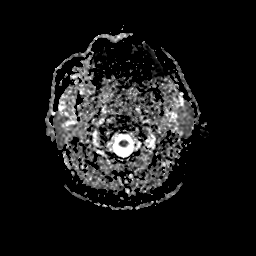
[im 23/46]
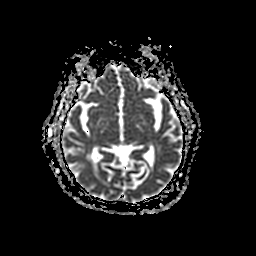
[im 46/46]
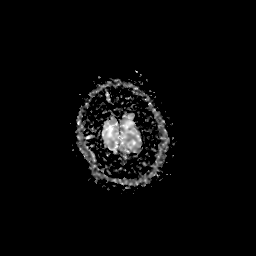

[32 of 48 positions shown; findings below may reference images not displayed]

FINDINGS: MRI HEAD FINDINGS

Gray and white matter signal is within normal limits for age
throughout the brain. 2 cm area of confluent restricted diffusion in
the right superior frontal gyrus white matter (subcortical and more
central) corresponding to the pre motor region (series 8, image 19,
series 5, image 15). No associated mass effect or hemorrhage.

No contralateral restricted diffusion. Questionable small focus of
right occipital lobe cortical restricted diffusion on series 11,
image 19. No posterior fossa restricted diffusion. Major
intracranial vascular flow voids are preserved.

Patchy and confluent bilateral cerebral white matter T2 and FLAIR
hyperintensity in addition to the acute finding above, in areas most
resembles chronic lacunar infarcts (right atrium). There are
occasional chronic micro hemorrhages in the brain (left
parieto-occipital sulcus). No cortical encephalomalacia identified.
Several chronic lacunar infarcts in the deep gray matter nuclei
including the left thalamus. Brainstem and cerebellum within normal
limits.

No midline shift, mass effect, evidence of mass lesion,
ventriculomegaly, extra-axial collection or acute intracranial
hemorrhage. Cervicomedullary junction and pituitary are within
normal limits. Negative visualized cervical spine. Normal bone
marrow signal Visible internal auditory structures appear normal.
Visualized orbit soft tissues are within normal limits. Visualized
paranasal sinuses and mastoids are clear. Visualized scalp soft
tissues are within normal limits.

MRA HEAD FINDINGS

Antegrade flow in the posterior circulation with codominant distal
vertebral arteries. Normal vertebrobasilar junction. Basilar artery
irregularity, but no hemodynamically significant basilar artery
stenosis. SCA and right PCA origins are within normal limits. Fetal
type left PCA origin. Small right posterior communicating artery.
Bilateral PCA branches are within normal limits.

Antegrade flow in both ICA siphons. Tortuous distal cervical ICA is
greater on the right. No ICA siphon stenosis. Ophthalmic and
posterior communicating artery origins are within normal limits.
Patent carotid termini. Normal ACA and left MCA origins. Anterior
communicating artery diminutive or absent. Visualized bilateral ACA
and left MCA branches are within normal limits.

Just be on the right MCA origin there is a short 2 mm segment of
irregularity and high-grade stenosis (series 605, image 7). The M1
segment remains patent. The right MCA bifurcation is patent. No
major right MCA branch occlusion is identified.
IMPRESSION: MRI BRAIN:

1. Small acute infarct in the right MCA pre motor area affecting
white matter. No mass effect or hemorrhage.
2. Questionable small acute cortically based infarct in the right
occipital lobe (right PCA territory).
3. Underlying chronic small vessel disease.

MRA BRAIN:

1. Two mm high-grade stenosis of the right MCA proximal M1 segment
compatible with thromboembolic disease in this setting, and
congruent with the acute findings in #1 above.
2. No major right MCA branch occlusion identified. Otherwise
negative anterior circulation.
3. Basilar artery atherosclerosis without hemodynamically
significant stenosis. Otherwise negative posterior circulation.

## 2016-07-12 ENCOUNTER — Ambulatory Visit (INDEPENDENT_AMBULATORY_CARE_PROVIDER_SITE_OTHER): Admitting: *Deleted

## 2016-07-12 DIAGNOSIS — I63511 Cerebral infarction due to unspecified occlusion or stenosis of right middle cerebral artery: Secondary | ICD-10-CM | POA: Diagnosis not present

## 2016-07-12 NOTE — Progress Notes (Signed)
Carelink Summary Report / Loop Recorder 

## 2016-08-02 ENCOUNTER — Telehealth: Payer: Self-pay | Admitting: Cardiology

## 2016-08-02 NOTE — Telephone Encounter (Signed)
Attempted to call pt in regards to his home monitor being disconnected. No answer and unable to leave a message.

## 2016-08-06 LAB — CUP PACEART REMOTE DEVICE CHECK: MDC IDC SESS DTM: 20170831083614

## 2016-08-13 ENCOUNTER — Ambulatory Visit (INDEPENDENT_AMBULATORY_CARE_PROVIDER_SITE_OTHER): Admitting: *Deleted

## 2016-08-13 DIAGNOSIS — I63511 Cerebral infarction due to unspecified occlusion or stenosis of right middle cerebral artery: Secondary | ICD-10-CM | POA: Diagnosis not present

## 2016-08-13 NOTE — Progress Notes (Signed)
Carelink Summary Report / Loop Recorder 

## 2016-09-10 ENCOUNTER — Ambulatory Visit (INDEPENDENT_AMBULATORY_CARE_PROVIDER_SITE_OTHER): Admitting: *Deleted

## 2016-09-10 DIAGNOSIS — I63511 Cerebral infarction due to unspecified occlusion or stenosis of right middle cerebral artery: Secondary | ICD-10-CM

## 2016-09-10 NOTE — Progress Notes (Signed)
Carelink Summary Report / Loop Recorder 

## 2016-09-14 ENCOUNTER — Telehealth: Payer: Self-pay | Admitting: Cardiology

## 2016-09-14 NOTE — Telephone Encounter (Signed)
Attempted to call pt and request that pt send manual transmission b/c home monitor has not updated in at least 14 days. No answer and unable to leave a message.

## 2016-09-20 ENCOUNTER — Telehealth: Payer: Self-pay | Admitting: Cardiology

## 2016-09-20 NOTE — Telephone Encounter (Signed)
Attempted to call pt b/c his home monitor has not updated in the last 14 days. No answer and unable to leave a message.   

## 2016-09-22 LAB — CUP PACEART REMOTE DEVICE CHECK
Implantable Pulse Generator Implant Date: 20150811
MDC IDC SESS DTM: 20170930083825

## 2016-09-22 NOTE — Progress Notes (Signed)
Carelink summary report received. Battery status OK. Normal device function. No new symptom episodes, tachy episodes, brady, or pause episodes. 7 AF 1% 1 new ECG appears SR PVCs. +Xarelto. Monthly summary reports and ROV/PRN

## 2016-10-10 ENCOUNTER — Ambulatory Visit (INDEPENDENT_AMBULATORY_CARE_PROVIDER_SITE_OTHER): Admitting: *Deleted

## 2016-10-10 DIAGNOSIS — I63511 Cerebral infarction due to unspecified occlusion or stenosis of right middle cerebral artery: Secondary | ICD-10-CM

## 2016-10-10 NOTE — Progress Notes (Signed)
Carelink Summary Report / Loop Recorder 

## 2016-10-16 LAB — CUP PACEART REMOTE DEVICE CHECK
Date Time Interrogation Session: 20171030090536
MDC IDC PG IMPLANT DT: 20150811

## 2016-10-16 NOTE — Progress Notes (Signed)
Carelink summary report received. Battery status OK. Normal device function. No new symptom episodes, tachy episodes, brady, or pause episodes. No new AF episodes. Monthly summary reports and ROV/PRN 

## 2016-11-09 ENCOUNTER — Encounter: Payer: Self-pay | Admitting: Cardiology

## 2016-11-09 ENCOUNTER — Ambulatory Visit (INDEPENDENT_AMBULATORY_CARE_PROVIDER_SITE_OTHER): Admitting: *Deleted

## 2016-11-09 DIAGNOSIS — I63511 Cerebral infarction due to unspecified occlusion or stenosis of right middle cerebral artery: Secondary | ICD-10-CM

## 2016-11-09 NOTE — Progress Notes (Signed)
Carelink Summary Report / Loop Recorder 

## 2016-11-20 LAB — CUP PACEART REMOTE DEVICE CHECK
Date Time Interrogation Session: 20171129094038
MDC IDC PG IMPLANT DT: 20150811

## 2016-11-27 ENCOUNTER — Encounter: Admitting: Internal Medicine

## 2016-12-10 ENCOUNTER — Encounter: Payer: Self-pay | Admitting: Internal Medicine

## 2016-12-10 ENCOUNTER — Ambulatory Visit (INDEPENDENT_AMBULATORY_CARE_PROVIDER_SITE_OTHER): Admitting: *Deleted

## 2016-12-10 DIAGNOSIS — I63511 Cerebral infarction due to unspecified occlusion or stenosis of right middle cerebral artery: Secondary | ICD-10-CM | POA: Diagnosis not present

## 2016-12-10 NOTE — Progress Notes (Signed)
Carelink Summary Report / Loop Recorder 

## 2016-12-11 ENCOUNTER — Telehealth: Payer: Self-pay | Admitting: Cardiology

## 2016-12-11 NOTE — Telephone Encounter (Signed)
LMOVM requesting that pt send manual transmission b/c home monitor has not updated in at least 14 days.    

## 2016-12-24 LAB — CUP PACEART REMOTE DEVICE CHECK
Date Time Interrogation Session: 20171229093657
MDC IDC PG IMPLANT DT: 20150811

## 2017-01-08 ENCOUNTER — Ambulatory Visit (INDEPENDENT_AMBULATORY_CARE_PROVIDER_SITE_OTHER): Admitting: *Deleted

## 2017-01-08 DIAGNOSIS — I63511 Cerebral infarction due to unspecified occlusion or stenosis of right middle cerebral artery: Secondary | ICD-10-CM

## 2017-01-08 LAB — CUP PACEART REMOTE DEVICE CHECK
Date Time Interrogation Session: 20180128103654
Implantable Pulse Generator Implant Date: 20150811

## 2017-01-08 NOTE — Progress Notes (Signed)
Carelink summary report received. Battery status OK. Normal device function. No new symptom episodes, tachy episodes, brady, or pause episodes. 6 AF 0% 1 w/ ECG appears SR w/ PAC +Xarelto. Monthly summary reports and ROV/PRN

## 2017-01-08 NOTE — Progress Notes (Signed)
Carelink Summary Report / Loop Recorder 

## 2017-01-25 LAB — CUP PACEART REMOTE DEVICE CHECK
Date Time Interrogation Session: 20180227103931
Implantable Pulse Generator Implant Date: 20150811

## 2017-02-07 ENCOUNTER — Ambulatory Visit (INDEPENDENT_AMBULATORY_CARE_PROVIDER_SITE_OTHER): Admitting: *Deleted

## 2017-02-07 DIAGNOSIS — I63511 Cerebral infarction due to unspecified occlusion or stenosis of right middle cerebral artery: Secondary | ICD-10-CM

## 2017-02-07 NOTE — Progress Notes (Signed)
Carelink Summary Report / Loop Recorder 

## 2017-02-22 LAB — CUP PACEART REMOTE DEVICE CHECK
Date Time Interrogation Session: 20180329103721
MDC IDC PG IMPLANT DT: 20150811

## 2017-03-11 ENCOUNTER — Ambulatory Visit (INDEPENDENT_AMBULATORY_CARE_PROVIDER_SITE_OTHER): Admitting: *Deleted

## 2017-03-11 DIAGNOSIS — I63511 Cerebral infarction due to unspecified occlusion or stenosis of right middle cerebral artery: Secondary | ICD-10-CM | POA: Diagnosis not present

## 2017-03-11 NOTE — Progress Notes (Signed)
Carelink Summary Report / Loop Recorder 

## 2017-03-23 LAB — CUP PACEART REMOTE DEVICE CHECK
Date Time Interrogation Session: 20180428113720
MDC IDC PG IMPLANT DT: 20150811

## 2017-03-23 NOTE — Progress Notes (Signed)
Carelink summary report received. Battery status OK. Normal device function. No new symptom episodes, tachy episodes, brady, or pause episodes. 1 AF 0.4% ECG appears AF w/ RVR. Known hx. +Xarelto. Monthly summary reports and ROV/PRN

## 2017-04-09 ENCOUNTER — Ambulatory Visit (INDEPENDENT_AMBULATORY_CARE_PROVIDER_SITE_OTHER): Admitting: *Deleted

## 2017-04-09 DIAGNOSIS — I63511 Cerebral infarction due to unspecified occlusion or stenosis of right middle cerebral artery: Secondary | ICD-10-CM

## 2017-04-09 NOTE — Progress Notes (Signed)
Carelink Summary Report / Loop Recorder 

## 2017-04-12 LAB — CUP PACEART REMOTE DEVICE CHECK
Date Time Interrogation Session: 20180528113640
MDC IDC PG IMPLANT DT: 20150811

## 2017-05-08 ENCOUNTER — Encounter: Admitting: *Deleted

## 2017-06-07 ENCOUNTER — Ambulatory Visit (INDEPENDENT_AMBULATORY_CARE_PROVIDER_SITE_OTHER): Payer: Medicare Other | Admitting: *Deleted

## 2017-06-07 DIAGNOSIS — I63511 Cerebral infarction due to unspecified occlusion or stenosis of right middle cerebral artery: Secondary | ICD-10-CM

## 2017-06-10 NOTE — Progress Notes (Signed)
Carelink Summary Report / Loop Recorder 

## 2017-06-12 ENCOUNTER — Telehealth: Payer: Self-pay | Admitting: Cardiology

## 2017-06-12 NOTE — Telephone Encounter (Signed)
LMOVM requesting that pt send manual transmission b/c home monitor has not updated in at least 14 days.    

## 2017-06-20 LAB — CUP PACEART REMOTE DEVICE CHECK
Implantable Pulse Generator Implant Date: 20150811
MDC IDC SESS DTM: 20180727123727

## 2017-07-08 ENCOUNTER — Ambulatory Visit (INDEPENDENT_AMBULATORY_CARE_PROVIDER_SITE_OTHER): Payer: Medicare Other | Admitting: *Deleted

## 2017-07-08 DIAGNOSIS — I63511 Cerebral infarction due to unspecified occlusion or stenosis of right middle cerebral artery: Secondary | ICD-10-CM

## 2017-07-08 NOTE — Progress Notes (Signed)
Carelink Summary Report / Loop Recorder 

## 2017-07-16 LAB — CUP PACEART REMOTE DEVICE CHECK
MDC IDC PG IMPLANT DT: 20150811
MDC IDC SESS DTM: 20180826160952

## 2017-08-06 ENCOUNTER — Encounter
